# Patient Record
Sex: Male | Born: 1965 | Race: White | Hispanic: No | Marital: Married | Smoking: Former smoker
Health system: Southern US, Community
[De-identification: ages and names within clinical notes are randomized; demographics above are authoritative.]

## PROBLEM LIST (undated history)

## (undated) DIAGNOSIS — M779 Enthesopathy, unspecified: Secondary | ICD-10-CM

## (undated) DIAGNOSIS — E291 Testicular hypofunction: Secondary | ICD-10-CM

## (undated) DIAGNOSIS — R002 Palpitations: Secondary | ICD-10-CM

## (undated) DIAGNOSIS — K760 Fatty (change of) liver, not elsewhere classified: Secondary | ICD-10-CM

## (undated) DIAGNOSIS — R7303 Prediabetes: Secondary | ICD-10-CM

## (undated) DIAGNOSIS — K219 Gastro-esophageal reflux disease without esophagitis: Secondary | ICD-10-CM

## (undated) DIAGNOSIS — R7989 Other specified abnormal findings of blood chemistry: Secondary | ICD-10-CM

## (undated) DIAGNOSIS — N529 Male erectile dysfunction, unspecified: Secondary | ICD-10-CM

## (undated) DIAGNOSIS — C44310 Basal cell carcinoma of skin of unspecified parts of face: Secondary | ICD-10-CM

## (undated) DIAGNOSIS — I10 Essential (primary) hypertension: Secondary | ICD-10-CM

## (undated) DIAGNOSIS — E7800 Pure hypercholesterolemia, unspecified: Secondary | ICD-10-CM

## (undated) DIAGNOSIS — E785 Hyperlipidemia, unspecified: Secondary | ICD-10-CM

## (undated) HISTORY — DX: Prediabetes: R73.03

## (undated) HISTORY — DX: Male erectile dysfunction, unspecified: N52.9

## (undated) HISTORY — DX: Testicular hypofunction: E29.1

## (undated) HISTORY — DX: Fatty (change of) liver, not elsewhere classified: K76.0

## (undated) HISTORY — DX: Basal cell carcinoma of skin of unspecified parts of face: C44.310

## (undated) HISTORY — DX: Hyperlipidemia, unspecified: E78.5

## (undated) HISTORY — DX: Enthesopathy, unspecified: M77.9

## (undated) HISTORY — DX: Other specified abnormal findings of blood chemistry: R79.89

## (undated) HISTORY — DX: Palpitations: R00.2

## (undated) HISTORY — DX: Gastro-esophageal reflux disease without esophagitis: K21.9

---

## 2004-09-24 ENCOUNTER — Ambulatory Visit (HOSPITAL_COMMUNITY): Admission: RE | Admit: 2004-09-24 | Discharge: 2004-09-24 | Payer: Self-pay | Admitting: Orthopedic Surgery

## 2004-09-24 ENCOUNTER — Ambulatory Visit (HOSPITAL_BASED_OUTPATIENT_CLINIC_OR_DEPARTMENT_OTHER): Admission: RE | Admit: 2004-09-24 | Discharge: 2004-09-24 | Payer: Self-pay | Admitting: Orthopedic Surgery

## 2015-09-01 ENCOUNTER — Other Ambulatory Visit: Payer: Self-pay | Admitting: Cardiology

## 2015-09-01 DIAGNOSIS — I10 Essential (primary) hypertension: Secondary | ICD-10-CM

## 2015-09-18 ENCOUNTER — Ambulatory Visit
Admission: RE | Admit: 2015-09-18 | Discharge: 2015-09-18 | Disposition: A | Discharge status: Home / Self Care | Payer: No Typology Code available for payment source | Source: Ambulatory Visit | Attending: Cardiology | Admitting: Cardiology

## 2015-09-18 DIAGNOSIS — I10 Essential (primary) hypertension: Secondary | ICD-10-CM

## 2017-07-28 ENCOUNTER — Other Ambulatory Visit: Payer: Self-pay | Admitting: Family Medicine

## 2017-07-28 ENCOUNTER — Ambulatory Visit
Admission: RE | Admit: 2017-07-28 | Discharge: 2017-07-28 | Disposition: A | Discharge status: Home / Self Care | Payer: No Typology Code available for payment source | Source: Ambulatory Visit | Attending: Family Medicine | Admitting: Family Medicine

## 2017-07-28 DIAGNOSIS — J189 Pneumonia, unspecified organism: Secondary | ICD-10-CM

## 2017-07-28 DIAGNOSIS — J181 Lobar pneumonia, unspecified organism: Principal | ICD-10-CM

## 2019-04-04 DIAGNOSIS — I10 Essential (primary) hypertension: Secondary | ICD-10-CM | POA: Diagnosis not present

## 2019-04-04 DIAGNOSIS — E782 Mixed hyperlipidemia: Secondary | ICD-10-CM | POA: Diagnosis not present

## 2019-04-04 DIAGNOSIS — K219 Gastro-esophageal reflux disease without esophagitis: Secondary | ICD-10-CM | POA: Diagnosis not present

## 2019-04-04 DIAGNOSIS — E291 Testicular hypofunction: Secondary | ICD-10-CM | POA: Diagnosis not present

## 2019-04-04 DIAGNOSIS — Z125 Encounter for screening for malignant neoplasm of prostate: Secondary | ICD-10-CM | POA: Diagnosis not present

## 2019-04-04 DIAGNOSIS — Z0000 Encounter for general adult medical examination without abnormal findings: Secondary | ICD-10-CM | POA: Diagnosis not present

## 2021-05-20 ENCOUNTER — Other Ambulatory Visit: Payer: Self-pay | Admitting: Family Medicine

## 2021-05-20 DIAGNOSIS — E782 Mixed hyperlipidemia: Secondary | ICD-10-CM

## 2021-06-08 ENCOUNTER — Encounter (HOSPITAL_COMMUNITY): Payer: Self-pay | Admitting: Emergency Medicine

## 2021-06-08 ENCOUNTER — Emergency Department (HOSPITAL_COMMUNITY): Payer: No Typology Code available for payment source

## 2021-06-08 ENCOUNTER — Other Ambulatory Visit: Payer: Self-pay

## 2021-06-08 ENCOUNTER — Emergency Department (HOSPITAL_COMMUNITY)
Admission: EM | Admit: 2021-06-08 | Discharge: 2021-06-09 | Disposition: A | Discharge status: Home / Self Care | Payer: No Typology Code available for payment source | Attending: Emergency Medicine | Admitting: Emergency Medicine

## 2021-06-08 DIAGNOSIS — Z20822 Contact with and (suspected) exposure to covid-19: Secondary | ICD-10-CM | POA: Insufficient documentation

## 2021-06-08 DIAGNOSIS — R410 Disorientation, unspecified: Secondary | ICD-10-CM | POA: Insufficient documentation

## 2021-06-08 DIAGNOSIS — R404 Transient alteration of awareness: Secondary | ICD-10-CM | POA: Diagnosis not present

## 2021-06-08 DIAGNOSIS — R451 Restlessness and agitation: Secondary | ICD-10-CM | POA: Insufficient documentation

## 2021-06-08 DIAGNOSIS — R4182 Altered mental status, unspecified: Secondary | ICD-10-CM | POA: Diagnosis present

## 2021-06-08 HISTORY — DX: Pure hypercholesterolemia, unspecified: E78.00

## 2021-06-08 HISTORY — DX: Essential (primary) hypertension: I10

## 2021-06-08 LAB — COMPREHENSIVE METABOLIC PANEL
ALT: 52 U/L — ABNORMAL HIGH (ref 0–44)
AST: 33 U/L (ref 15–41)
Albumin: 4.4 g/dL (ref 3.5–5.0)
Alkaline Phosphatase: 43 U/L (ref 38–126)
Anion gap: 9 (ref 5–15)
BUN: 22 mg/dL — ABNORMAL HIGH (ref 6–20)
CO2: 22 mmol/L (ref 22–32)
Calcium: 9.3 mg/dL (ref 8.9–10.3)
Chloride: 108 mmol/L (ref 98–111)
Creatinine, Ser: 0.99 mg/dL (ref 0.61–1.24)
GFR, Estimated: >60 mL/min (ref >60)
Glucose, Bld: 103 mg/dL — ABNORMAL HIGH (ref 70–99)
Potassium: 3.7 mmol/L (ref 3.5–5.1)
Sodium: 139 mmol/L (ref 135–145)
Total Bilirubin: 0.9 mg/dL (ref 0.3–1.2)
Total Protein: 7 g/dL (ref 6.5–8.1)

## 2021-06-08 LAB — RAPID URINE DRUG SCREEN, HOSP PERFORMED
Amphetamines: NOT DETECTED
Barbiturates: NOT DETECTED
Benzodiazepines: NOT DETECTED
Cocaine: NOT DETECTED
Opiates: NOT DETECTED
Tetrahydrocannabinol: NOT DETECTED

## 2021-06-08 LAB — DIFFERENTIAL
Abs Immature Granulocytes: 0.05 10*3/uL (ref 0.00–0.07)
Basophils Absolute: 0.1 10*3/uL (ref 0.0–0.1)
Basophils Relative: 1 %
Eosinophils Absolute: 0.1 10*3/uL (ref 0.0–0.5)
Eosinophils Relative: 1 %
Immature Granulocytes: 1 %
Lymphocytes Relative: 26 %
Lymphs Abs: 2.7 10*3/uL (ref 0.7–4.0)
Monocytes Absolute: 0.8 10*3/uL (ref 0.1–1.0)
Monocytes Relative: 7 %
Neutro Abs: 6.8 10*3/uL (ref 1.7–7.7)
Neutrophils Relative %: 64 %

## 2021-06-08 LAB — URINALYSIS, ROUTINE W REFLEX MICROSCOPIC
Bilirubin Urine: NEGATIVE
Glucose, UA: NEGATIVE mg/dL
Hgb urine dipstick: NEGATIVE
Ketones, ur: NEGATIVE mg/dL
Leukocytes,Ua: NEGATIVE
Nitrite: NEGATIVE
Protein, ur: NEGATIVE mg/dL
Specific Gravity, Urine: 1.003 — ABNORMAL LOW (ref 1.005–1.030)
pH: 6 (ref 5.0–8.0)

## 2021-06-08 LAB — CBC
HCT: 46.6 % (ref 39.0–52.0)
Hemoglobin: 15.6 g/dL (ref 13.0–17.0)
MCH: 30.1 pg (ref 26.0–34.0)
MCHC: 33.5 g/dL (ref 30.0–36.0)
MCV: 90 fL (ref 80.0–100.0)
Platelets: 232 10*3/uL (ref 150–400)
RBC: 5.18 MIL/uL (ref 4.22–5.81)
RDW: 13.2 % (ref 11.5–15.5)
WBC: 10.5 10*3/uL (ref 4.0–10.5)
nRBC: 0 % (ref 0.0–0.2)

## 2021-06-08 LAB — PROTIME-INR
INR: 1.1 (ref 0.8–1.2)
Prothrombin Time: 13.6 seconds (ref 11.4–15.2)

## 2021-06-08 LAB — ETHANOL: Alcohol, Ethyl (B): <10 mg/dL (ref <10)

## 2021-06-08 LAB — I-STAT BETA HCG BLOOD, ED (MC, WL, AP ONLY): I-stat hCG, quantitative: <5 m[IU]/mL (ref <5)

## 2021-06-08 LAB — RESP PANEL BY RT-PCR (FLU A&B, COVID) ARPGX2
Influenza A by PCR: NEGATIVE
Influenza B by PCR: NEGATIVE
SARS Coronavirus 2 by RT PCR: NEGATIVE

## 2021-06-08 LAB — APTT: aPTT: 26 seconds (ref 24–36)

## 2021-06-08 NOTE — ED Triage Notes (Signed)
Pt reports going out to mow this evening and had episodes of confusion since 1830 ? ?

## 2021-06-08 NOTE — ED Provider Triage Note (Signed)
Emergency Medicine Provider Triage Evaluation Note ? ?Steven Le , a 56 y.o. male  was evaluated in triage.  Patient presents with his wife for evaluation of memory problem that began shortly PTA. LKN 18:30 tonight @ dinner. Went to mow the grass after dinner and had a "dream like" state, returned to the house and could not remember what occurred outside, he was confused/disoriented per his wife with repetitive questioning. This continued and he repeatedly went outside to check to see if he truly did mow the grass. He is feeling better currently, his wife reports he still seems a bit off/repetitive and he still does not remember mowing or his repetitively checking to see if he mowed the lawn. He has had some increased stressors at home recently, otherwise was a normal day. His wife states she asked him about other sxs during his repetitive questioning (numbness, weakness, dizziness, pain) and he denied them throughout the night.  ? ?Review of Systems  ?Positive: Memory problem, repetitive speech ?Negative: Slurred speech, numbness, weakness, headache, change in vision, chest pain ? ?Physical Exam  ?BP 135/82 (BP Location: Right Arm)   Pulse 81   Temp 98.4 ?F (36.9 ?C) (Oral)   Resp 16   SpO2 96%  ?Gen:   Awake, no distress   ?Resp:  Normal effort  ?MSK:   Moves extremities without difficulty  ?Other:  Alert.  Clear speech.  CN III through XII grossly intact.  Sensation grossly tact bilateral upper and lower extremities.  5 out of 5 symmetric grip strength and strength with plantar dorsiflexion bilaterally.  No drift.  Ambulatory with steady gait.  Oriented x3. ? ?Medical Decision Making  ?Medically screening exam initiated at 10:21 PM.  Appropriate orders placed.  Steven Le was informed that the remainder of the evaluation will be completed by another provider, this initial triage assessment does not replace that evaluation, and the importance of remaining in the ED until their evaluation is  complete. ? ?Patient presenting with altered mental status described as difficulty with memory and repetitive questioning, improved on arrival but still does not recall earlier events and seems to be a little repetitive still her his wife. He has no focal neurologic deficits on exam and is speaking fluently with steady gait.  Last known normal 1830. ? ?I discussed case with neurologist Dr. Rory Percy, no need to activate code stroke, recommends stroke work up with CTA head/neck and subsequent MRI brain wo contrast.  ?  ?Amaryllis Dyke, PA-C ?06/08/21 2231 ? ?

## 2021-06-09 ENCOUNTER — Emergency Department (HOSPITAL_COMMUNITY): Payer: No Typology Code available for payment source

## 2021-06-09 MED ORDER — IOHEXOL 350 MG/ML SOLN
100.0000 mL | Freq: Once | INTRAVENOUS | Status: AC | PRN
  Administered 2021-06-09: 75 mL via INTRAVENOUS

## 2021-06-09 NOTE — ED Notes (Signed)
Patient transported to MRI 

## 2021-06-09 NOTE — ED Provider Notes (Signed)
?[facility] ?Provider Note ? ? ?CSN: [id number] ?Arrival date & time: 06/08/21  2125 ? ?  ? ?History ? ?Chief Complaint  ?Patient presents with  ? Altered Mental Status  ? ? ?Steven Le is a 56 y.o. male. ? ?Patient presents to the emergency department for evaluation of altered mental status.  Patient was in his normal state of good health earlier today.  He came home from work and did some yard work.  After he finished mowing and trimming, he came into the house.  His wife noticed that he was slightly agitated and confused when he came in.  He did not seem to remember what he had been doing and was asking the same questions over and over.  She did not notice any facial asymmetry, slurred speech, unilateral weakness.  Patient denies any associated headache.  He denies any possibility of a fall or head injury. ? ? ?  ? ?Home Medications ?Prior to Admission medications   ?Medication Sig Start Date End Date Taking? Authorizing Provider  ?losartan (COZAAR) 50 MG tablet Take 50 mg by mouth daily. 05/04/21  Yes [provider]  ?Multiple Vitamin (MULTIVITAMIN) tablet Take 1 tablet by mouth daily.   Yes [provider]  ?   ? ?Allergies    ?Cough & chest congestion dm [dextromethorphan-guaifenesin]   ? ?Review of Systems   ?Review of Systems ? ?Physical Exam ?Updated Vital Signs ?BP 119/73   Pulse 63   Temp 98.4 ?F (36.9 ?C) (Oral)   Resp 17   Ht '6\' 1"'$  (1.854 m)   Wt 89.8 kg   SpO2 96%   BMI 26.12 kg/m?  ?Physical Exam ?Vitals and nursing note reviewed.  ?Constitutional:   ?   General: He is not in acute distress. ?   Appearance: He is well-developed.  ?HENT:  ?   Head: Normocephalic and atraumatic.  ?   Mouth/Throat:  ?   Mouth: Mucous membranes are moist.  ?Eyes:  ?   General: Vision grossly intact. Gaze aligned appropriately.  ?   Extraocular Movements: Extraocular movements intact.  ?   Conjunctiva/sclera: Conjunctivae normal.  ?Cardiovascular:  ?    Rate and Rhythm: Normal rate and regular rhythm.  ?   Pulses: Normal pulses.  ?   Heart sounds: Normal heart sounds, S1 normal and S2 normal. No murmur heard. ?  No friction rub. No gallop.  ?Pulmonary:  ?   Effort: Pulmonary effort is normal. No respiratory distress.  ?   Breath sounds: Normal breath sounds.  ?Abdominal:  ?   Palpations: Abdomen is soft.  ?   Tenderness: There is no abdominal tenderness. There is no guarding or rebound.  ?   Hernia: No hernia is present.  ?Musculoskeletal:     ?   General: No swelling.  ?   Cervical back: Full passive range of motion without pain, normal range of motion and neck supple. No pain with movement, spinous process tenderness or muscular tenderness. Normal range of motion.  ?   Right lower leg: No edema.  ?   Left lower leg: No edema.  ?Skin: ?   General: Skin is warm and dry.  ?   Capillary Refill: Capillary refill takes less than 2 seconds.  ?   Findings: No ecchymosis, erythema, lesion or wound.  ?Neurological:  ?   Mental Status: He is alert and oriented to person, place, and time.  ?   GCS: GCS eye subscore is 4.  GCS verbal subscore is 5. GCS motor subscore is 6.  ?   Cranial Nerves: Cranial nerves 2-12 are intact.  ?   Sensory: Sensation is intact.  ?   Motor: Motor function is intact. No weakness or abnormal muscle tone.  ?   Coordination: Coordination is intact.  ?Psychiatric:     ?   Mood and Affect: Mood normal.     ?   Speech: Speech normal.     ?   Behavior: Behavior normal.  ? ? ?ED Results / Procedures / Treatments   ?Labs ?(all labs ordered are listed, but only abnormal results are displayed) ?Labs Reviewed  ?COMPREHENSIVE METABOLIC PANEL - Abnormal; Notable for the following components:  ?    Result Value  ? Glucose, Bld 103 (*)   ? BUN 22 (*)   ? ALT 52 (*)   ? All other components within normal limits  ?URINALYSIS, ROUTINE W REFLEX MICROSCOPIC - Abnormal; Notable for the following components:  ? Color, Urine COLORLESS (*)   ? Specific Gravity, Urine  1.003 (*)   ? All other components within normal limits  ?RESP PANEL BY RT-PCR (FLU A&B, COVID) ARPGX2  ?ETHANOL  ?PROTIME-INR  ?APTT  ?CBC  ?DIFFERENTIAL  ?RAPID URINE DRUG SCREEN, HOSP PERFORMED  ?I-STAT CHEM 8, ED  ?I-STAT BETA HCG BLOOD, ED ([facility], [facility], [facility] ONLY)  ? ? ?EKG ?EKG Interpretation ? ?Date/Time:  Tuesday Jun 08 2021 22:29:46 EDT ?Ventricular Rate:  80 ?PR Interval:  148 ?QRS Duration: 92 ?QT Interval:  388 ?QTC Calculation: 447 ?R Axis:   85 ?Text Interpretation: Normal sinus rhythm Normal ECG Confirmed by Orpah Greek [phone]) on 06/08/2021 11:33:25 PM ? ?Radiology ?CT ANGIO HEAD NECK W WO CM ? ?Result Date: 06/09/2021 ?CLINICAL DATA:  Memory loss EXAM: CT ANGIOGRAPHY HEAD AND NECK TECHNIQUE: Multidetector CT imaging of the head and neck was performed using the standard protocol during bolus administration of intravenous contrast. Multiplanar CT image reconstructions and MIPs were obtained to evaluate the vascular anatomy. Carotid stenosis measurements (when applicable) are obtained utilizing NASCET criteria, using the distal internal carotid diameter as the denominator. RADIATION DOSE REDUCTION: This exam was performed according to the departmental dose-optimization program which includes automated exposure control, adjustment of the mA and/or kV according to patient size and/or use of iterative reconstruction technique. CONTRAST:  8m OMNIPAQUE IOHEXOL 350 MG/ML SOLN COMPARISON:  None Available. FINDINGS: CT HEAD FINDINGS Brain: There is no mass, hemorrhage or extra-axial collection. The size and configuration of the ventricles and extra-axial CSF spaces are normal. There is no acute or chronic infarction. The brain parenchyma is normal. Skull: The visualized skull base, calvarium and extracranial soft tissues are normal. Sinuses/Orbits: No fluid levels or advanced mucosal thickening of the visualized paranasal sinuses. No mastoid or middle ear effusion. The orbits are normal. CTA NECK FINDINGS  SKELETON: There is no bony spinal canal stenosis. No lytic or blastic lesion. OTHER NECK: Normal pharynx, larynx and major salivary glands. No cervical lymphadenopathy. Unremarkable thyroid gland. UPPER CHEST: No pneumothorax or pleural effusion. No nodules or masses. AORTIC ARCH: There is no calcific atherosclerosis of the aortic arch. There is no aneurysm, dissection or hemodynamically significant stenosis of the visualized portion of the aorta. Conventional 3 vessel aortic branching pattern. The visualized proximal subclavian arteries are widely patent. RIGHT CAROTID SYSTEM: Normal without aneurysm, dissection or stenosis. LEFT CAROTID SYSTEM: Normal without aneurysm, dissection or stenosis. VERTEBRAL ARTERIES: Left dominant configuration. Both origins are clearly patent. There is no  dissection, occlusion or flow-limiting stenosis to the skull base (V1-V3 segments). CTA HEAD FINDINGS POSTERIOR CIRCULATION: --Vertebral arteries: Normal V4 segments. --Inferior cerebellar arteries: Normal. --Basilar artery: Normal. --Superior cerebellar arteries: Normal. --Posterior cerebral arteries (PCA): Normal. ANTERIOR CIRCULATION: --Intracranial internal carotid arteries: Normal. --Anterior cerebral arteries (ACA): Normal. Both A1 segments are present. Patent anterior communicating artery (a-comm). --Middle cerebral arteries (MCA): Normal. VENOUS SINUSES: As permitted by contrast timing, patent. ANATOMIC VARIANTS: None Review of the MIP images confirms the above findings. IMPRESSION: Normal CTA of the head and neck. Electronically Signed   By: Ulyses Jarred M.D.   On: 06/09/2021 00:44  ? ?MR BRAIN WO CONTRAST ? ?Result Date: 06/09/2021 ?CLINICAL DATA:  Mental status change EXAM: MRI HEAD WITHOUT CONTRAST TECHNIQUE: Multiplanar, multiecho pulse sequences of the brain and surrounding structures were obtained without intravenous contrast. COMPARISON:  None Available. FINDINGS: Brain: No acute infarct, mass effect or extra-axial  collection. No acute or chronic hemorrhage. Normal white matter signal, parenchymal volume and CSF spaces. The midline structures are normal. Vascular: Major flow voids are preserved. Skull and upper cervical spine:

## 2021-06-23 ENCOUNTER — Ambulatory Visit
Admission: RE | Admit: 2021-06-23 | Discharge: 2021-06-23 | Disposition: A | Discharge status: Home / Self Care | Payer: No Typology Code available for payment source | Source: Ambulatory Visit | Attending: Family Medicine | Admitting: Family Medicine

## 2021-06-23 DIAGNOSIS — E782 Mixed hyperlipidemia: Secondary | ICD-10-CM

## 2021-07-22 ENCOUNTER — Encounter: Payer: Self-pay | Admitting: Neurology

## 2021-07-22 ENCOUNTER — Ambulatory Visit: Payer: No Typology Code available for payment source | Admitting: Neurology

## 2021-07-22 VITALS — BP 102/64 | HR 78 | Ht 73.0 in | Wt 198.0 lb

## 2021-07-22 DIAGNOSIS — G454 Transient global amnesia: Secondary | ICD-10-CM

## 2021-07-22 DIAGNOSIS — E291 Testicular hypofunction: Secondary | ICD-10-CM | POA: Insufficient documentation

## 2021-07-22 DIAGNOSIS — R404 Transient alteration of awareness: Secondary | ICD-10-CM

## 2021-07-22 DIAGNOSIS — R7303 Prediabetes: Secondary | ICD-10-CM | POA: Insufficient documentation

## 2021-07-22 DIAGNOSIS — I10 Essential (primary) hypertension: Secondary | ICD-10-CM | POA: Insufficient documentation

## 2021-07-22 DIAGNOSIS — K219 Gastro-esophageal reflux disease without esophagitis: Secondary | ICD-10-CM | POA: Insufficient documentation

## 2021-07-22 NOTE — Patient Instructions (Signed)
Routine EEG, I will contact you to go over the results  Follow up with PCP Return as needed

## 2021-07-22 NOTE — Progress Notes (Signed)
[facility]  PATIENT: Steven Le DOB: Dec 31, 1965  REQUESTING CLINICIAN: Orpah Greek,* HISTORY FROM: Patient  REASON FOR VISIT: Transient alteration of awareness    HISTORICAL  CHIEF COMPLAINT:  Chief Complaint  Patient presents with   New Patient (Initial Visit)    NP internal referral for Transient alteration of awareness Pt was outside doing yard work, States episode lasted 2-3 hrs, reports no new sx today     HISTORY OF PRESENT ILLNESS:  This is a 56 year old gentleman past medical history of hypertension and hyperlipidemia who is presenting after one episode of transient alteration of awareness on May 17.  Patient reports in the evening around 6 PM he went outside to [facility] the lawn then during that time he noted that he did not feel right, was a little lightheaded and came into the house to talk to his wife.  Per chart review, wife described patient as being agitated, going in and out of the house to verify if he actually finished mowing the lawn, wife was on the phone with her nurse friend who directed her to go to the nearest ED.   By the time patient reached the ED he was back to his normal self, he did have MRI brain which was negative for any acute abnormality and he was discharged home.  He denies any previous history of the same and since then states that he has not had any additional events.  He reported that it was in the evening.  it was not too hot when he was mowing the lawn, and he was back to his normal self without any hydration.  OTHER MEDICAL CONDITIONS: Hypertension, Hyperlipidemia    REVIEW OF SYSTEMS: Full 14 system review of systems performed and negative with exception of: as noted in the HPI   ALLERGIES: Allergies  Allergen Reactions   Cough & Chest Congestion Dm [Dextromethorphan-Guaifenesin]     HOME MEDICATIONS: Outpatient Medications Prior to Visit  Medication Sig Dispense Refill   furosemide (LASIX) 20 MG  tablet Take 20 mg by mouth.     losartan (COZAAR) 50 MG tablet Take 50 mg by mouth daily.     Multiple Vitamin (MULTIVITAMIN) tablet Take 1 tablet by mouth daily.     No facility-administered medications prior to visit.    PAST MEDICAL HISTORY: Past Medical History:  Diagnosis Date   Hypercholesteremia    Hypertension     PAST SURGICAL HISTORY: History reviewed. No pertinent surgical history.  FAMILY HISTORY: History reviewed. No pertinent family history.  SOCIAL HISTORY: Social History   Socioeconomic History   Marital status: Married    Spouse name: Not on file   Number of children: Not on file   Years of education: Not on file   Highest education level: Not on file  Occupational History   Not on file  Tobacco Use   Smoking status: Never   Smokeless tobacco: Never  Substance and Sexual Activity   Alcohol use: Not Currently   Drug use: Never   Sexual activity: Yes  Other Topics Concern   Not on file  Social History Narrative   Not on file   Social Determinants of Health   Financial Resource Strain: Not on file  Food Insecurity: Not on file  Transportation Needs: Not on file  Physical Activity: Not on file  Stress: Not on file  Social Connections: Not on file  Intimate Partner Violence: Not on file     PHYSICAL EXAM  GENERAL EXAM/CONSTITUTIONAL: Vitals:  Vitals:   07/22/21 0811  BP: 102/64  Pulse: 78  Weight: 198 lb (89.8 kg)  Height: '6\' 1"'$  (1.854 m)   Body mass index is 26.12 kg/m. Wt Readings from Last 3 Encounters:  07/22/21 198 lb (89.8 kg)  06/08/21 198 lb (89.8 kg)   Patient is in no distress; well developed, nourished and groomed; neck is supple  EYES: Pupils round and reactive to light, Visual fields full to confrontation, Extraocular movements intacts,   MUSCULOSKELETAL: Gait, strength, tone, movements noted in Neurologic exam below  NEUROLOGIC: MENTAL STATUS:      No data to display         awake, alert, oriented to  person, place and time recent and remote memory intact normal attention and concentration language fluent, comprehension intact, naming intact fund of knowledge appropriate  CRANIAL NERVE:  2nd, 3rd, 4th, 6th - pupils equal and reactive to light, visual fields full to confrontation, extraocular muscles intact, no nystagmus 5th - facial sensation symmetric 7th - facial strength symmetric 8th - hearing intact 9th - palate elevates symmetrically, uvula midline 11th - shoulder shrug symmetric 12th - tongue protrusion midline  MOTOR:  normal bulk and tone, full strength in the BUE, BLE  SENSORY:  normal and symmetric to light touch, pinprick, temperature, vibration  COORDINATION:  finger-nose-finger, fine finger movements normal  REFLEXES:  deep tendon reflexes present and symmetric  GAIT/STATION:  normal   DIAGNOSTIC DATA (LABS, IMAGING, TESTING) - I reviewed patient records, labs, notes, testing and imaging myself where available.  Lab Results  Component Value Date   WBC 10.5 06/08/2021   HGB 15.6 06/08/2021   HCT 46.6 06/08/2021   MCV 90.0 06/08/2021   PLT 232 06/08/2021      Component Value Date/Time   NA 139 06/08/2021 2229   K 3.7 06/08/2021 2229   CL 108 06/08/2021 2229   CO2 22 06/08/2021 2229   GLUCOSE 103 (H) 06/08/2021 2229   BUN 22 (H) 06/08/2021 2229   CREATININE 0.99 06/08/2021 2229   CALCIUM 9.3 06/08/2021 2229   PROT 7.0 06/08/2021 2229   ALBUMIN 4.4 06/08/2021 2229   AST 33 06/08/2021 2229   ALT 52 (H) 06/08/2021 2229   ALKPHOS 43 06/08/2021 2229   BILITOT 0.9 06/08/2021 2229   GFRNONAA >60 06/08/2021 2229   No results found for: "CHOL", "HDL", "LDLCALC", "LDLDIRECT", "TRIG", "CHOLHDL" No results found for: "HGBA1C" No results found for: "VITAMINB12" No results found for: "TSH"  MRI Brain without contrast 06/09/21 Normal brain MRI    ASSESSMENT AND PLAN  56 y.o. year old male with history of hypertension and hyperlipidemia who is  presenting after one episode of alteration of awareness lasting up to 3 hours.  During this time patient was asking the same questions and doing the same repetitive motion of checking to make sure that he finished mowing the lawn.  He did have a MRI brain which was negative for any acute abnormality.  Patient episode likely is a transient global amnesia but cannot rule out seizures.  I will obtain a routine EEG for background calcification.  He did not have a stroke.  I will contact the patient to go over the result and if normal he can continue to follow with primary care and return as needed.   1. TGA (transient global amnesia)   2. Transient alteration of awareness      Patient Instructions  Routine EEG, I will contact you to go over the results  Follow up  with PCP Return as needed   Orders Placed This Encounter  Procedures   EEG adult    No orders of the defined types were placed in this encounter.   Return if symptoms worsen or fail to improve.  I have spent a total of 45 minutes dedicated to this patient today, preparing to see patient, performing a medically appropriate examination and evaluation, ordering tests and/or medications and procedures, and counseling and educating the patient/family/caregiver; independently interpreting result and communicating results to the family/patient/caregiver; and documenting clinical information in the electronic medical record.   Alric Ran, MD 07/22/2021, 8:46 AM  [facility] [address] [place] [employer], [place] [address] [phone]

## 2021-08-11 ENCOUNTER — Other Ambulatory Visit: Payer: Self-pay | Admitting: *Deleted

## 2021-08-11 ENCOUNTER — Encounter: Payer: Self-pay | Admitting: Neurology

## 2021-10-06 ENCOUNTER — Encounter: Payer: Self-pay | Admitting: Internal Medicine

## 2021-10-06 ENCOUNTER — Other Ambulatory Visit: Payer: Self-pay | Admitting: Internal Medicine

## 2021-10-06 ENCOUNTER — Ambulatory Visit: Payer: 59 | Attending: Internal Medicine | Admitting: Internal Medicine

## 2021-10-06 VITALS — BP 100/70 | HR 74 | Ht 73.0 in | Wt 202.0 lb

## 2021-10-06 DIAGNOSIS — I10 Essential (primary) hypertension: Secondary | ICD-10-CM | POA: Diagnosis not present

## 2021-10-06 DIAGNOSIS — E782 Mixed hyperlipidemia: Secondary | ICD-10-CM

## 2021-10-06 DIAGNOSIS — Z8673 Personal history of transient ischemic attack (TIA), and cerebral infarction without residual deficits: Secondary | ICD-10-CM | POA: Diagnosis not present

## 2021-10-06 DIAGNOSIS — I2584 Coronary atherosclerosis due to calcified coronary lesion: Secondary | ICD-10-CM

## 2021-10-06 DIAGNOSIS — R002 Palpitations: Secondary | ICD-10-CM | POA: Diagnosis not present

## 2021-10-06 DIAGNOSIS — I2510 Atherosclerotic heart disease of native coronary artery without angina pectoris: Secondary | ICD-10-CM | POA: Insufficient documentation

## 2021-10-06 MED ORDER — DILTIAZEM HCL 30 MG PO TABS: 30.0000 mg | ORAL_TABLET | Freq: Four times a day (QID) | ORAL | 3 refills | Status: AC | PRN

## 2021-10-06 NOTE — Patient Instructions (Signed)
Medication Instructions:  Your physician has recommended you make the following change in your medication:   START: diltiazem (Cardizem) 30 mg by mouth Every 6 hours as needed for heart palpitations  *If you need a refill on your cardiac medications before your next appointment, please call your pharmacy*   Lab Work: NONE If you have labs (blood work) drawn today and your tests are completely normal, you will receive your results only by: [place] (if you have MyChart) OR A paper copy in the mail If you have any lab test that is abnormal or we need to change your treatment, we will call you to review the results.   Testing/Procedures: Your physician has requested that you wear a 30 day heart monitor.     Follow-Up: At [facility], you and your health needs are our priority.  As part of our continuing mission to provide you with exceptional heart care, we have created designated Provider Care Teams.  These Care Teams include your primary Cardiologist (physician) and Advanced Practice Providers (APPs -  Physician Assistants and Nurse Practitioners) who all work together to provide you with the care you need, when you need it.  We recommend signing up for the patient portal called "MyChart".  Sign up information is provided on this After Visit Summary.  MyChart is used to connect with patients for Virtual Visits (Telemedicine).  Patients are able to view lab/test results, encounter notes, upcoming appointments, etc.  Non-urgent messages can be sent to your provider as well.   To learn more about what you can do with MyChart, go to [email or web address].    Your next appointment:   6 month(s)  The format for your next appointment:   In Person  Provider:   Werner Lean, MD     Other Instructions [employer] Cardiac Event Monitor Instructions Your physician has requested you wear your cardiac event monitor for ___30__ days, (1-30). [employer] may call or  text to confirm a shipping address. The monitor will be sent to a land address via UPS. [employer] will not ship a monitor to a PO BOX. It typically takes 3-5 days to receive your monitor after it has been enrolled. [employer] will assist with USPS tracking if your package is delayed. The telephone number for [employer] is [phone]. Once you have received your monitor, please review the enclosed instructions. Instruction tutorials can also be viewed under help and settings on the enclosed cell phone. Your monitor has already been registered assigning a specific monitor serial # to you.  Applying the monitor Remove cell phone from case and turn it on. The cell phone works as Dealer and needs to be within [employer] of you at all times. The cell phone will need to be charged on a daily basis. We recommend you plug the cell phone into the enclosed charger at your bedside table every night.  Monitor batteries: You will receive two monitor batteries labelled #1 and #2. These are your recorders. Plug battery #2 onto the second connection on the enclosed charger. Keep one battery on the charger at all times. This will keep the monitor battery deactivated. It will also keep it fully charged for when you need to switch your monitor batteries. A small light will be blinking on the battery emblem when it is charging. The light on the battery emblem will remain on when the battery is fully charged.  Open package of a Monitor strip. Insert battery #1 into black hood on strip and  gently squeeze monitor battery onto connection as indicated in instruction booklet. Set aside while preparing skin.  Choose location for your strip, vertical or horizontal, as indicated in the instruction booklet. Shave to remove all hair from location. There cannot be any lotions, oils, powders, or colognes on skin where monitor is to be applied. Wipe skin clean with enclosed Saline wipe. Dry skin completely.  Peel  paper labeled #1 off the back of the Monitor strip exposing the adhesive. Place the monitor on the chest in the vertical or horizontal position shown in the instruction booklet. One arrow on the monitor strip must be pointing upward. Carefully remove paper labeled #2, attaching remainder of strip to your skin. Try not to create any folds or wrinkles in the strip as you apply it.  Firmly press and release the circle in the center of the monitor battery. You will hear a small beep. This is turning the monitor battery on. The heart emblem on the monitor battery will light up every 5 seconds if the monitor battery in turned on and connected to the patient securely. Do not push and hold the circle down as this turns the monitor battery off. The cell phone will locate the monitor battery. A screen will appear on the cell phone checking the connection of your monitor strip. This may read poor connection initially but change to good connection within the next minute. Once your monitor accepts the connection you will hear a series of 3 beeps followed by a climbing crescendo of beeps. A screen will appear on the cell phone showing the two monitor strip placement options. Touch the picture that demonstrates where you applied the monitor strip.  Your monitor strip and battery are waterproof. You are able to shower, bathe, or swim with the monitor on. They just ask you do not submerge deeper than 3 feet underwater. We recommend removing the monitor if you are swimming in a lake, river, or ocean.  Your monitor battery will need to be switched to a fully charged monitor battery approximately once a week. The cell phone will alert you of an action which needs to be made.  On the cell phone, tap for details to reveal connection status, monitor battery status, and cell phone battery status. The green dots indicates your monitor is in good status. A red dot indicates there is something that needs your  attention.  To record a symptom, click the circle on the monitor battery. In 30-60 seconds a list of symptoms will appear on the cell phone. Select your symptom and tap save. Your monitor will record a sustained or significant arrhythmia regardless of you clicking the button. Some patients do not feel the heart rhythm irregularities. [employer] will notify us of any serious or critical events.  Refer to instruction booklet for instructions on switching batteries, changing strips, the Do not disturb or Pause features, or any additional questions.  Call [employer] at [phone], to confirm your monitor is transmitting and record your baseline. They will answer any questions you may have regarding the monitor instructions at that time.  Returning the monitor to [place] all equipment back into blue box. Peel off strip of paper to expose adhesive and close box securely. There is a prepaid UPS shipping label on this box. Drop in a UPS drop box, or at a UPS facility like [employer]. You may also contact [employer] to arrange UPS to pick up monitor package at your home.   Important Information About Sugar

## 2021-10-06 NOTE — Progress Notes (Signed)
Cardiology Office Note:    Date:  10/06/2021   ID:  Steven Le, DOB 09-09-1965, MRN [id number]  PCP:  Antony Contras, MD   [facility] Cardiologist:  Werner Lean, MD     Referring MD: Antony Contras, MD   CC: New palpitations Consulted for the evaluation of PAF at the behest of Dr. Moreen Fowler.  History of Present Illness:    Steven Le is a 56 y.o. male with a hx of HTN, HLD, TIA 05/2021, elevated CAC who presents with new PAF.  Patient notes that he is feeling having sudden onset heart racing.  On his smart watch his phone thinks he is in atrial fibrillation.   Feeling well. 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    Has two episodes of "A fib on his phone,"  One occurred in September that occurred for almost two hours.  Felt chest pain during that time.  Had another episode during lunchtime and after exercising.  Able to golf and do lawn work without symptoms.  No other chest pain, chest pressure, chest tightness, chest stinging.  No shortness of breath, DOE .  No PND or orthopnea.  No weight gain, leg swelling , or abdominal swelling.  No syncope or near syncope   Patient reports prior cardiac testing including CAC.  Ten years ago had a stress test that was negative.     Past Medical History:  Diagnosis Date   ED (erectile dysfunction)    Facial basal cell cancer    Fatty infiltration of liver    GERD (gastroesophageal reflux disease)    Hypercholesteremia    Hyperlipidemia    Hypertension    Hypogonadism in male    Low testosterone    Palpitations    Pre-diabetes    Tendonitis     No past surgical history on file.  Current Medications: Current Meds  Medication Sig   aspirin EC 81 MG tablet Take 81 mg by mouth daily. Swallow whole.   diltiazem (CARDIZEM) 30 MG tablet Take 1 tablet (30 mg total) by mouth every 6 (six) hours as needed (palpitations).   furosemide (LASIX) 20 MG tablet Take 20 mg by  mouth.   losartan (COZAAR) 50 MG tablet Take 50 mg by mouth daily.   Multiple Vitamin (MULTIVITAMIN) tablet Take 1 tablet by mouth daily.   rosuvastatin (CRESTOR) 5 MG tablet Take 5 mg by mouth daily.     Allergies:   Cough & chest congestion dm [dextromethorphan-guaifenesin]   Social History   Socioeconomic History   Marital status: Married    Spouse name: Not on file   Number of children: Not on file   Years of education: Not on file   Highest education level: Not on file  Occupational History   Not on file  Tobacco Use   Smoking status: Former    Types: Cigarettes   Smokeless tobacco: Never  Substance and Sexual Activity   Alcohol use: Not Currently   Drug use: Never   Sexual activity: Yes  Other Topics Concern   Not on file  Social History Narrative   Not on file   Social Determinants of Health   Financial Resource Strain: Not on file  Food Insecurity: Not on file  Transportation Needs: Not on file  Physical Activity: Not on file  Stress: Not on file  Social Connections: Not on file     Family History: The patient's family history includes Cancer - Colon (age  of onset: 48) in his father; Cancer - Prostate (age of onset: 68) in his father; Colon polyps in his brother; Diabetes in his father and mother; Healthy in his brother, daughter, sister, and son; Hypertension in his mother; Skin cancer in his mother. Father had a prior stroke (still living) Mother had potentially CAD (she took nitroglycerin).  ROS:   Please see the history of present illness.     All other systems reviewed and are negative.  EKGs/Labs/Other Studies Reviewed:    The following studies were reviewed today:  EKG:  EKG is  ordered today.  The ekg ordered today demonstrates  10/06/21: SR   CT CARDIAC SCORING 09/18/2015  Narrative CLINICAL DATA:  Essential hypertension. Family history of heart disease. Nonsmoker.  EXAM: CT HEART FOR CALCIUM SCORING  TECHNIQUE: CT heart was performed  on a 64 channel system using prospective ECG gating.  A non-contrast exam for calcium scoring was performed.  Note that this exam targets the heart and the chest was not imaged in its entirety.  COMPARISON:  None.  FINDINGS: Technical quality: Good.  CORONARY CALCIUM  Total Agatston Score: 0  OTHER  FINDINGS:  Cardiovascular: Normal heart size.  Normal aortic caliber.  Mediastinum/Nodes: No imaged thoracic adenopathy, given limitations of noncontrast technique.  Lungs/Pleura: No pleural fluid.  Clear lungs.  Upper Abdomen: Moderate hepatic steatosis. Normal imaged portions of the spleen, stomach.  Musculoskeletal: No acute osseous abnormality.  IMPRESSION: 1. No coronary calcium. 2. Moderate hepatic steatosis. 3.  No acute process in the chest.   Electronically Signed By: Abigail Miyamoto M.D. On: 09/18/2015 13:55     Recent Labs: 06/08/2021: ALT 52; BUN 22; Creatinine, Ser 0.99; Hemoglobin 15.6; Platelets 232; Potassium 3.7; Sodium 139  Recent Lipid Panel No results found for: "CHOL", "TRIG", "HDL", "CHOLHDL", "VLDL", "LDLCALC", "LDLDIRECT"       Physical Exam:    VS:  BP 100/70   Pulse 74   Ht '6\' 1"'$  (1.854 m)   Wt 202 lb (91.6 kg)   SpO2 95%   BMI 26.65 kg/m     Wt Readings from Last 3 Encounters:  10/06/21 202 lb (91.6 kg)  07/22/21 198 lb (89.8 kg)  06/08/21 198 lb (89.8 kg)    Gen: No distress   Neck: No JVD Cardiac: No Rubs or Gallops, No murmur, RRR +2 radial pulses Respiratory: Clear to auscultation bilaterally, normal effort, normal  respiratory rate GI: Soft, nontender, non-distended  MS: No  edema;  moves all extremities Integument: Skin feels warm Neuro:  At time of evaluation, alert and oriented to person/place/time/situation  Psych: Normal affect, patient feels well   ASSESSMENT:    1. Palpitations   2. History of TIA (transient ischemic attack)   3. Mixed hyperlipidemia   4. Essential hypertension   5. Coronary artery  calcification    PLAN:    New Palpitations With history of HTN, HLD, and TIA - has had three episodes in the past month; given his new TID will get 30 Day [employer] - will start diltiazem 30 mg PO q6hr PRN Palpitations - if needs standing therapy will need to drop his losartan to at least 25 mg PO daily  Elevated CAC - will get follow up Lipids and ALT with PCP on Monday - continue ASA for now  Six months unless new AF; me or APP        Medication Adjustments/Labs and Tests Ordered: Current medicines are reviewed at length with the patient today.  Concerns  regarding medicines are outlined above.  Orders Placed This Encounter  Procedures   CARDIAC EVENT MONITOR   EKG 12-Lead   Meds ordered this encounter  Medications   diltiazem (CARDIZEM) 30 MG tablet    Sig: Take 1 tablet (30 mg total) by mouth every 6 (six) hours as needed (palpitations).    Dispense:  90 tablet    Refill:  3    Patient Instructions  Medication Instructions:  Your physician has recommended you make the following change in your medication:   START: diltiazem (Cardizem) 30 mg by mouth Every 6 hours as needed for heart palpitations  *If you need a refill on your cardiac medications before your next appointment, please call your pharmacy*   Lab Work: NONE If you have labs (blood work) drawn today and your tests are completely normal, you will receive your results only by: [place] (if you have MyChart) OR A paper copy in the mail If you have any lab test that is abnormal or we need to change your treatment, we will call you to review the results.   Testing/Procedures: Your physician has requested that you wear a 30 day heart monitor.     Follow-Up: At [facility], you and your health needs are our priority.  As part of our continuing mission to provide you with exceptional heart care, we have created designated Provider Care Teams.  These Care Teams include your primary  Cardiologist (physician) and Advanced Practice Providers (APPs -  Physician Assistants and Nurse Practitioners) who all work together to provide you with the care you need, when you need it.  We recommend signing up for the patient portal called "MyChart".  Sign up information is provided on this After Visit Summary.  MyChart is used to connect with patients for Virtual Visits (Telemedicine).  Patients are able to view lab/test results, encounter notes, upcoming appointments, etc.  Non-urgent messages can be sent to your provider as well.   To learn more about what you can do with MyChart, go to [email or web address].    Your next appointment:   6 month(s)  The format for your next appointment:   In Person  Provider:   Werner Lean, MD     Other Instructions [employer] Cardiac Event Monitor Instructions Your physician has requested you wear your cardiac event monitor for ___30__ days, (1-30). [employer] may call or text to confirm a shipping address. The monitor will be sent to a land address via UPS. [employer] will not ship a monitor to a PO BOX. It typically takes 3-5 days to receive your monitor after it has been enrolled. [employer] will assist with USPS tracking if your package is delayed. The telephone number for [employer] is [phone]. Once you have received your monitor, please review the enclosed instructions. Instruction tutorials can also be viewed under help and settings on the enclosed cell phone. Your monitor has already been registered assigning a specific monitor serial # to you.  Applying the monitor Remove cell phone from case and turn it on. The cell phone works as Dealer and needs to be within [employer] of you at all times. The cell phone will need to be charged on a daily basis. We recommend you plug the cell phone into the enclosed charger at your bedside table every night.  Monitor batteries: You will receive two monitor batteries  labelled #1 and #2. These are your recorders. Plug battery #2 onto the second connection on the enclosed charger. Keep one  battery on the charger at all times. This will keep the monitor battery deactivated. It will also keep it fully charged for when you need to switch your monitor batteries. A small light will be blinking on the battery emblem when it is charging. The light on the battery emblem will remain on when the battery is fully charged.  Open package of a Monitor strip. Insert battery #1 into black hood on strip and gently squeeze monitor battery onto connection as indicated in instruction booklet. Set aside while preparing skin.  Choose location for your strip, vertical or horizontal, as indicated in the instruction booklet. Shave to remove all hair from location. There cannot be any lotions, oils, powders, or colognes on skin where monitor is to be applied. Wipe skin clean with enclosed Saline wipe. Dry skin completely.  Peel paper labeled #1 off the back of the Monitor strip exposing the adhesive. Place the monitor on the chest in the vertical or horizontal position shown in the instruction booklet. One arrow on the monitor strip must be pointing upward. Carefully remove paper labeled #2, attaching remainder of strip to your skin. Try not to create any folds or wrinkles in the strip as you apply it.  Firmly press and release the circle in the center of the monitor battery. You will hear a small beep. This is turning the monitor battery on. The heart emblem on the monitor battery will light up every 5 seconds if the monitor battery in turned on and connected to the patient securely. Do not push and hold the circle down as this turns the monitor battery off. The cell phone will locate the monitor battery. A screen will appear on the cell phone checking the connection of your monitor strip. This may read poor connection initially but change to good connection within the next minute.  Once your monitor accepts the connection you will hear a series of 3 beeps followed by a climbing crescendo of beeps. A screen will appear on the cell phone showing the two monitor strip placement options. Touch the picture that demonstrates where you applied the monitor strip.  Your monitor strip and battery are waterproof. You are able to shower, bathe, or swim with the monitor on. They just ask you do not submerge deeper than 3 feet underwater. We recommend removing the monitor if you are swimming in a lake, river, or ocean.  Your monitor battery will need to be switched to a fully charged monitor battery approximately once a week. The cell phone will alert you of an action which needs to be made.  On the cell phone, tap for details to reveal connection status, monitor battery status, and cell phone battery status. The green dots indicates your monitor is in good status. A red dot indicates there is something that needs your attention.  To record a symptom, click the circle on the monitor battery. In 30-60 seconds a list of symptoms will appear on the cell phone. Select your symptom and tap save. Your monitor will record a sustained or significant arrhythmia regardless of you clicking the button. Some patients do not feel the heart rhythm irregularities. [employer] will notify us of any serious or critical events.  Refer to instruction booklet for instructions on switching batteries, changing strips, the Do not disturb or Pause features, or any additional questions.  Call [employer] at [phone], to confirm your monitor is transmitting and record your baseline. They will answer any questions you may have regarding the monitor instructions at  that time.  Returning the monitor to [place] all equipment back into blue box. Peel off strip of paper to expose adhesive and close box securely. There is a prepaid UPS shipping label on this box. Drop in a UPS drop box, or at a UPS  facility like [employer]. You may also contact [employer] to arrange UPS to pick up monitor package at your home.   Important Information About Sugar         Signed, Werner Lean, MD  10/06/2021 9:49 AM    [place]

## 2021-10-11 ENCOUNTER — Ambulatory Visit: Payer: 59 | Attending: Internal Medicine

## 2021-10-11 DIAGNOSIS — R002 Palpitations: Secondary | ICD-10-CM | POA: Diagnosis not present

## 2021-10-11 DIAGNOSIS — Z8673 Personal history of transient ischemic attack (TIA), and cerebral infarction without residual deficits: Secondary | ICD-10-CM | POA: Diagnosis not present

## 2021-10-18 ENCOUNTER — Telehealth: Payer: Self-pay | Admitting: Student

## 2021-10-18 DIAGNOSIS — I480 Paroxysmal atrial fibrillation: Secondary | ICD-10-CM

## 2021-10-18 MED ORDER — APIXABAN 5 MG PO TABS: 5.0000 mg | ORAL_TABLET | Freq: Two times a day (BID) | ORAL | 11 refills | Status: DC

## 2021-10-18 NOTE — Addendum Note (Signed)
Addended by: Sande Rives on: 10/18/2021 08:45 AM   Modules accepted: Orders

## 2021-10-18 NOTE — Telephone Encounter (Addendum)
   Received page from Answering Service about concerns for critical EKG.  Called and spoke with [employer] staff who reported patient had a 44-second episode of atrial fibrillation with rates in the 150s earlier this morning around 6:04 AM central time.  Underlying rhythm was normal sinus rhythm in the 90s.  Reviewed patient's charts.  He was recently seen by Dr. Gasper Sells for further evaluation of palpitations and reported his smart watch had indicated that he was in atrial fibrillation twice.  One episode reportedly lasted for almost 2 hours.  He was started on short acting Cardizem  as needed for palpitations and 30 day monitor was ordered.  He does have a history of TIA.  CHA2DS2-VASc score = 3 given history of TIA and hypertension.  Therefore, I would recommend starting Eliquis 5 mg twice daily.  I tried call patient with the above recommendations but was unable to reach him.  Left a detailed message on his answering machine asking him to call back and that he is okay per DPR).  I will route this message to Dr. Gasper Sells so that he is aware.  I will also route this message to the triage pool so that they can try to call him again later today with above recommendations.  Darreld Mclean, PA-C 10/18/2021 8:23 AM  Patient called back and I was able to speak with him. He does report intermittent shortness of breath and chest tightness when he is in atrial fibrillation as well as some heart racing. Discussed when to take his PRN Diltiazem. Discussed recommendation for Eliquis and he is in agreement with this. He has no history of GI bleed, brain bleed, or internal bleeding. Recommended stopping Aspirin while on Eliquis. Will send in Eliquis to requested pharmacy.  Also arranged follow-up with Dr. Gasper Sells on 11/03/2021 to review full monitor results and see if he needs to be transitioned to long acting Diltiazem. Did not do this today because BP is soft at times. Systolic BP was 426 at  recent office visit.  Darreld Mclean, PA-C 10/18/2021 8:44 AM

## 2021-10-18 NOTE — Telephone Encounter (Signed)
error 

## 2021-10-18 NOTE — Telephone Encounter (Addendum)
   Cardiac Monitor Alert  Date of alert:  10/18/2021   Patient Name: Steven Le  DOB: Nov 21, 1965  MRN: [id number]   [place] Cardiologist: Werner Lean, MD  [facility] EP:  None    Monitor Information: Cardiac Event Monitor [[employer]]  Reason:  Palpitations and TIA Ordering provider:  Dr. Gasper Sells   Alert Atrial Fibrillation/Flutter This is the 1st alert for this rhythm.  The patient has no hx of Atrial Fibrillation/Flutter.     Next Cardiology Appointment   Date:  11/04/23  Provider:  Dr. Gasper Sells.   The patient was contacted today.  He is symptomatic.  He reports the following symptoms:  He does report intermittent shortness of breath and chest tightness when he is in atrial fibrillation as well as some heart racing. Arrhythmia, symptoms and history reviewed with [name] and Dr. Gasper Sells.  Plan:  Start Eliquis 5 mg tablets BID and   [name], [name], MD          10/18/21 10:25 AM If he has another spell we will drop his losartan to 25 mg PO daily and start diltiazem 120 mg XL.  Other:  Dr. Gasper Sells reviewed and signed monitor report.    [name], RN  10/18/2021 11:10 AM

## 2021-10-18 NOTE — Telephone Encounter (Signed)
Called pt advised of 30 days worth of samples, 30 day free trial card and $10 co pay card.  Pt will come by and pick up today on lunch breaks.  Pt had no questions or concerns.

## 2021-11-03 ENCOUNTER — Ambulatory Visit: Payer: 59 | Attending: Internal Medicine | Admitting: Internal Medicine

## 2021-11-03 ENCOUNTER — Encounter: Payer: Self-pay | Admitting: Internal Medicine

## 2021-11-03 VITALS — BP 100/60 | HR 70 | Ht 73.0 in | Wt 207.0 lb

## 2021-11-03 DIAGNOSIS — E782 Mixed hyperlipidemia: Secondary | ICD-10-CM | POA: Diagnosis not present

## 2021-11-03 DIAGNOSIS — Z8673 Personal history of transient ischemic attack (TIA), and cerebral infarction without residual deficits: Secondary | ICD-10-CM

## 2021-11-03 DIAGNOSIS — I10 Essential (primary) hypertension: Secondary | ICD-10-CM

## 2021-11-03 DIAGNOSIS — I480 Paroxysmal atrial fibrillation: Secondary | ICD-10-CM

## 2021-11-03 NOTE — Patient Instructions (Signed)
Medication Instructions:  Your physician recommends that you continue on your current medications as directed. Please refer to the Current Medication list given to you today.  *If you need a refill on your cardiac medications before your next appointment, please call your pharmacy*   Lab Work: ON DAY OF ECHO: CBC If you have labs (blood work) drawn today and your tests are completely normal, you will receive your results only by: [place] (if you have MyChart) OR A paper copy in the mail If you have any lab test that is abnormal or we need to change your treatment, we will call you to review the results.   Testing/Procedures: Your physician has requested that you have an echocardiogram. Echocardiography is a painless test that uses sound waves to create images of your heart. It provides your doctor with information about the size and shape of your heart and how well your heart's chambers and valves are working. This procedure takes approximately one hour. There are no restrictions for this procedure.    Follow-Up: At [facility], you and your health needs are our priority.  As part of our continuing mission to provide you with exceptional heart care, we have created designated Provider Care Teams.  These Care Teams include your primary Cardiologist (physician) and Advanced Practice Providers (APPs -  Physician Assistants and Nurse Practitioners) who all work together to provide you with the care you need, when you need it.  We recommend signing up for the patient portal called "MyChart".  Sign up information is provided on this After Visit Summary.  MyChart is used to connect with patients for Virtual Visits (Telemedicine).  Patients are able to view lab/test results, encounter notes, upcoming appointments, etc.  Non-urgent messages can be sent to your provider as well.   To learn more about what you can do with MyChart, go to [email or web address].    Your next  appointment:   12 month(s)  The format for your next appointment:   In Person  Provider:   Werner Lean, MD      Important Information About Sugar

## 2021-11-03 NOTE — Progress Notes (Signed)
Cardiology Office Note:    Date:  11/03/2021   ID:  [name], DOB 1965-04-10, MRN [id number]  PCP:  Antony Contras, MD   [place] [facility] Cardiologist:  Werner Lean, MD     Referring MD: Antony Contras, MD   CC: Follow up AF  History of Present Illness:    Steven Le is a 56 y.o. male with a hx of HTN, HLD, TIA 05/2021, elevated CAC who presents with new PAF. 2023: Found AF on heart monitor.  Started CCB.  Have no received full monitor.  Patient notes that he is doing fine.   Rare scratches lead to notable bleeding. There are no interval hospital/ED visit.    No chest pain or pressure .  No SOB/DOE and no PND/Orthopnea.  No weight gain or leg swelling.  Rare palpitations.  Has not needed diltiazem.   AMB BP 120/80s but low Bps in our office.  Takes Sildenafil as a PRN.    Past Medical History:  Diagnosis Date   ED (erectile dysfunction)    Facial basal cell cancer    Fatty infiltration of liver    GERD (gastroesophageal reflux disease)    Hypercholesteremia    Hyperlipidemia    Hypertension    Hypogonadism in male    Low testosterone    Palpitations    Pre-diabetes    Tendonitis     No past surgical history on file.  Current Medications: Current Meds  Medication Sig   apixaban (ELIQUIS) 5 MG TABS tablet Take 1 tablet (5 mg total) by mouth 2 (two) times daily.   diltiazem (CARDIZEM) 30 MG tablet Take 1 tablet (30 mg total) by mouth every 6 (six) hours as needed (palpitations).   furosemide (LASIX) 20 MG tablet Take 20 mg by mouth.   losartan (COZAAR) 50 MG tablet Take 50 mg by mouth daily.   Multiple Vitamin (MULTIVITAMIN) tablet Take 1 tablet by mouth daily.   rosuvastatin (CRESTOR) 5 MG tablet Take 5 mg by mouth daily.   sildenafil (VIAGRA) 25 MG tablet Take 25 mg by mouth as needed for erectile dysfunction.     Allergies:   Cough & chest congestion dm [dextromethorphan-guaifenesin]   Social History   Socioeconomic  History   Marital status: Married    Spouse name: Not on file   Number of children: Not on file   Years of education: Not on file   Highest education level: Not on file  Occupational History   Not on file  Tobacco Use   Smoking status: Former    Types: Cigarettes   Smokeless tobacco: Never  Substance and Sexual Activity   Alcohol use: Not Currently   Drug use: Never   Sexual activity: Yes  Other Topics Concern   Not on file  Social History Narrative   Not on file   Social Determinants of Health   Financial Resource Strain: Not on file  Food Insecurity: Not on file  Transportation Needs: Not on file  Physical Activity: Not on file  Stress: Not on file  Social Connections: Not on file    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    Family History: The patient's family history includes Cancer - Colon (age of onset: 25) in his father; Cancer - Prostate (age of onset: 39) in his father; Colon polyps in his brother; Diabetes in his father and mother; Healthy in his brother, daughter, sister, and son; Hypertension in his mother; Skin  cancer in his mother. Father had a prior stroke (still living) Mother had potentially CAD (she took nitroglycerin).  ROS:   Please see the history of present illness.     All other systems reviewed and are negative.  EKGs/Labs/Other Studies Reviewed:    The following studies were reviewed today:  EKG:  EKG is  ordered today.  The ekg ordered today demonstrates  10/06/21: SR   CT CARDIAC SCORING 09/18/2015  Narrative CLINICAL DATA:  Essential hypertension. Family history of heart disease. Nonsmoker.  EXAM: CT HEART FOR CALCIUM SCORING  TECHNIQUE: CT heart was performed on a 64 channel system using prospective ECG gating.  A non-contrast exam for calcium scoring was performed.  Note that this exam targets the heart and the chest was not imaged in its entirety.  COMPARISON:   None.  FINDINGS: Technical quality: Good.  CORONARY CALCIUM  Total Agatston Score: 0  OTHER  FINDINGS:  Cardiovascular: Normal heart size.  Normal aortic caliber.  Mediastinum/Nodes: No imaged thoracic adenopathy, given limitations of noncontrast technique.  Lungs/Pleura: No pleural fluid.  Clear lungs.  Upper Abdomen: Moderate hepatic steatosis. Normal imaged portions of the spleen, stomach.  Musculoskeletal: No acute osseous abnormality.  IMPRESSION: 1. No coronary calcium. 2. Moderate hepatic steatosis. 3.  No acute process in the chest.   Electronically Signed By: Abigail Miyamoto M.D. On: 09/18/2015 13:55     Recent Labs: 06/08/2021: ALT 52; BUN 22; Creatinine, Ser 0.99; Hemoglobin 15.6; Platelets 232; Potassium 3.7; Sodium 139  Recent Lipid Panel No results found for: "CHOL", "TRIG", "HDL", "CHOLHDL", "VLDL", "LDLCALC", "LDLDIRECT"       Physical Exam:    VS:  BP 100/60   Pulse 70   Ht '6\' 1"'$  (1.854 m)   Wt 207 lb (93.9 kg)   SpO2 95%   BMI 27.31 kg/m     Wt Readings from Last 3 Encounters:  11/03/21 207 lb (93.9 kg)  10/06/21 202 lb (91.6 kg)  07/22/21 198 lb (89.8 kg)    Gen: No distress   Neck: No JVD Cardiac: No Rubs or Gallops, No murmur, RRR +2 radial pulses Respiratory: Clear to auscultation bilaterally, normal effort, normal  respiratory rate GI: Soft, nontender, non-distended  MS: No  edema;  moves all extremities Integument: Skin feels warm Neuro:  At time of evaluation, alert and oriented to person/place/time/situation  Psych: Normal affect, patient feels well   ASSESSMENT:    1. Paroxysmal atrial fibrillation (HCC)   2. History of TIA (transient ischemic attack)   3. Mixed hyperlipidemia   4. Essential hypertension    PLAN:    PAF With history of HTN, HLD, and TIA - CHADSVASC 3 - diltiazem as a PRM - on DOAC, CBC and echo - restart ambulatory BP monitoring; at echo results will check in about BP, if low no sildenafil while  low and cut cozaar to 25 mg - DOAC no ASA  HLD hx of TIA - LDL is near 70 goal on current statin no change for now  One year with me         Medication Adjustments/Labs and Tests Ordered: Current medicines are reviewed at length with the patient today.  Concerns regarding medicines are outlined above.  Orders Placed This Encounter  Procedures   CBC   ECHOCARDIOGRAM COMPLETE   No orders of the defined types were placed in this encounter.   Patient Instructions  Medication Instructions:  Your physician recommends that you continue on your current medications as directed.  Please refer to the Current Medication list given to you today.  *If you need a refill on your cardiac medications before your next appointment, please call your pharmacy*   Lab Work: ON DAY OF ECHO: CBC If you have labs (blood work) drawn today and your tests are completely normal, you will receive your results only by: [place] (if you have MyChart) OR A paper copy in the mail If you have any lab test that is abnormal or we need to change your treatment, we will call you to review the results.   Testing/Procedures: Your physician has requested that you have an echocardiogram. Echocardiography is a painless test that uses sound waves to create images of your heart. It provides your doctor with information about the size and shape of your heart and how well your heart's chambers and valves are working. This procedure takes approximately one hour. There are no restrictions for this procedure.    Follow-Up: At [facility], you and your health needs are our priority.  As part of our continuing mission to provide you with exceptional heart care, we have created designated Provider Care Teams.  These Care Teams include your primary Cardiologist (physician) and Advanced Practice Providers (APPs -  Physician Assistants and Nurse Practitioners) who all work together to provide you with the care you  need, when you need it.  We recommend signing up for the patient portal called "MyChart".  Sign up information is provided on this After Visit Summary.  MyChart is used to connect with patients for Virtual Visits (Telemedicine).  Patients are able to view lab/test results, encounter notes, upcoming appointments, etc.  Non-urgent messages can be sent to your provider as well.   To learn more about what you can do with MyChart, go to [email or web address].    Your next appointment:   12 month(s)  The format for your next appointment:   In Person  Provider:   Werner Lean, MD      Important Information About Sugar         Signed, Werner Lean, MD  11/03/2021 5:00 PM    [place]

## 2021-11-19 ENCOUNTER — Other Ambulatory Visit (HOSPITAL_COMMUNITY): Payer: 59

## 2021-11-19 ENCOUNTER — Other Ambulatory Visit: Payer: 59

## 2021-11-29 ENCOUNTER — Ambulatory Visit: Payer: 59

## 2021-11-29 ENCOUNTER — Ambulatory Visit: Payer: 59 | Attending: Internal Medicine

## 2021-11-29 DIAGNOSIS — I480 Paroxysmal atrial fibrillation: Secondary | ICD-10-CM

## 2021-11-29 LAB — ECHOCARDIOGRAM COMPLETE
AR max vel: 1.9 cm2
AV Area VTI: 1.85 cm2
AV Area mean vel: 1.84 cm2
AV Mean grad: 7 mmHg
AV Peak grad: 13.2 mmHg
Ao pk vel: 1.82 m/s
Area-P 1/2: 3.86 cm2
S' Lateral: 2.6 cm

## 2021-11-29 LAB — CBC
Hematocrit: 43.8 % (ref 37.5–51.0)
Hemoglobin: 14.7 g/dL (ref 13.0–17.7)
MCH: 30.1 pg (ref 26.6–33.0)
MCHC: 33.6 g/dL (ref 31.5–35.7)
MCV: 90 fL (ref 79–97)
Platelets: 237 10*3/uL (ref 150–450)
RBC: 4.88 x10E6/uL (ref 4.14–5.80)
RDW: 14.3 % (ref 11.6–15.4)
WBC: 7.3 10*3/uL (ref 3.4–10.8)

## 2021-12-01 ENCOUNTER — Telehealth: Payer: Self-pay | Admitting: Internal Medicine

## 2021-12-01 NOTE — Telephone Encounter (Signed)
° °  Pt is returning call to get echo result °

## 2021-12-01 NOTE — Telephone Encounter (Signed)
Called pt reviewed results please see result notes.

## 2022-04-14 NOTE — Progress Notes (Deleted)
Cardiology Office Note:    Date:  04/14/2022   ID:  Steven Le, DOB 08/30/1965, MRN [id number]  PCP:  Antony Contras, MD   [place] Prov[facility] Cardiologist:  Werner Lean, MD     Referring MD: Antony Contras, MD   CC: Follow up AF  History of Present Illness:    Steven Le is a 56 y.o. male with a hx of HTN, HLD, TIA 05/2021, elevated CAC who presents with new PAF. 2023: Found AF on heart monitor.  Started CCB.  Have no received full monitor.  Patient notes that he is doing ***.   Since last visit notes *** . There are no*** interval hospital/ED visit.    No chest pain or pressure ***.  No SOB/DOE*** and no PND/Orthopnea***.  No weight gain or leg swelling***.  No palpitations or syncope ***.  Ambulatory blood pressure ***.    Past Medical History:  Diagnosis Date   ED (erectile dysfunction)    Facial basal cell cancer    Fatty infiltration of liver    GERD (gastroesophageal reflux disease)    Hypercholesteremia    Hyperlipidemia    Hypertension    Hypogonadism in male    Low testosterone    Palpitations    Pre-diabetes    Tendonitis     No past surgical history on file.  Current Medications: No outpatient medications have been marked as taking for the 04/18/22 encounter (Appointment) with Werner Lean, MD.     Allergies:   Cough & chest congestion dm [dextromethorphan-guaifenesin]   Social History   Socioeconomic History   Marital status: Married    Spouse name: Not on file   Number of children: Not on file   Years of education: Not on file   Highest education level: Not on file  Occupational History   Not on file  Tobacco Use   Smoking status: Former    Types: Cigarettes   Smokeless tobacco: Never  Substance and Sexual Activity   Alcohol use: Not Currently   Drug use: Never   Sexual activity: Yes  Other Topics Concern   Not on file  Social History Narrative   Not on file   Social Determinants of  Health   Financial Resource Strain: Not on file  Food Insecurity: Not on file  Transportation Needs: Not on file  Physical Activity: Not on file  Stress: Not on file  Social Connections: Not on file    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    Family History: The patient's family history includes Cancer - Colon (age of onset: 19) in his father; Cancer - Prostate (age of onset: 41) in his father; Colon polyps in his brother; Diabetes in his father and mother; Healthy in his brother, daughter, sister, and son; Hypertension in his mother; Skin cancer in his mother. Father had a prior stroke (still living) Mother had potentially CAD (she took nitroglycerin).  ROS:   Please see the history of present illness.     All other systems reviewed and are negative.  EKGs/Labs/Other Studies Reviewed:    The following studies were reviewed today:  EKG:  EKG is  ordered today.  The ekg ordered today demonstrates  10/06/21: SR   Cardiac Studies & Procedures       ECHOCARDIOGRAM  ECHOCARDIOGRAM COMPLETE 11/29/2021  Narrative ECHOCARDIOGRAM REPORT    Patient Name:   Steven Le Date of Exam: 11/29/2021 Medical Rec #:  [id number]  Height:       73.0 in Accession #:    [id number]      Weight:       207.0 lb Date of Birth:  November 19, 1965       BSA:          2.183 m Patient Age:    88 years        BP:           100/60 mmHg Patient Gender: M               HR:           77 bpm. Exam Location:  [place]  Procedure: 2D Echo, Cardiac Doppler and Color Doppler  Indications:    I48.0 Atrial Fibrillation  History:        Patient has no prior history of Echocardiogram examinations. TIA, Arrythmias:Atrial Fibrillation; Risk Factors:Hypertension, Dyslipidemia, Family History of Coronary Artery Disease and Former Smoker. Palpitations.  Sonographer:    Deliah Boston RDCS Referring Phys: [facility] [name]  IMPRESSIONS   1. Left ventricular  ejection fraction, by estimation, is >75%. The left ventricle has hyperdynamic function. The left ventricle has no regional wall motion abnormalities. Left ventricular diastolic parameters were normal. 2. Right ventricular systolic function is normal. The right ventricular size is normal. There is normal pulmonary artery systolic pressure. The estimated right ventricular systolic pressure is 123456 mmHg. 3. The mitral valve is grossly normal. Trivial mitral valve regurgitation. 4. The aortic valve is tricuspid. Aortic valve regurgitation is not visualized. 5. The inferior vena cava is normal in size with greater than 50% respiratory variability, suggesting right atrial pressure of 3 mmHg.  Comparison(s): No prior Echocardiogram.  FINDINGS Left Ventricle: Left ventricular ejection fraction, by estimation, is >75%. The left ventricle has hyperdynamic function. The left ventricle has no regional wall motion abnormalities. The left ventricular internal cavity size was normal in size. There is no left ventricular hypertrophy. Left ventricular diastolic parameters were normal.  Right Ventricle: The right ventricular size is normal. No increase in right ventricular wall thickness. Right ventricular systolic function is normal. There is normal pulmonary artery systolic pressure. The tricuspid regurgitant velocity is 2.66 m/s, and with an assumed right atrial pressure of 3 mmHg, the estimated right ventricular systolic pressure is 123456 mmHg.  Left Atrium: Left atrial size was normal in size.  Right Atrium: Right atrial size was normal in size.  Pericardium: There is no evidence of pericardial effusion.  Mitral Valve: The mitral valve is grossly normal. Trivial mitral valve regurgitation.  Tricuspid Valve: The tricuspid valve is grossly normal. Tricuspid valve regurgitation is trivial.  Aortic Valve: The aortic valve is tricuspid. Aortic valve regurgitation is not visualized. Aortic valve mean gradient  measures 7.0 mmHg. Aortic valve peak gradient measures 13.2 mmHg. Aortic valve area, by VTI measures 1.85 cm.  Pulmonic Valve: The pulmonic valve was normal in structure. Pulmonic valve regurgitation is not visualized.  Aorta: The aortic root and ascending aorta are structurally normal, with no evidence of dilitation.  Venous: The inferior vena cava is normal in size with greater than 50% respiratory variability, suggesting right atrial pressure of 3 mmHg.  IAS/Shunts: No atrial level shunt detected by color flow Doppler.   LEFT VENTRICLE PLAX 2D LVIDd:         4.90 cm   Diastology LVIDs:         2.60 cm   LV e' medial:    12.60 cm/s LV PW:  0.90 cm   LV E/e' medial:  7.1 LV IVS:        0.80 cm   LV e' lateral:   18.80 cm/s LVOT diam:     2.00 cm   LV E/e' lateral: 4.8 LV SV:         61 LV SV Index:   28 LVOT Area:     3.14 cm   RIGHT VENTRICLE RV Basal diam:  3.20 cm RV S prime:     15.00 cm/s TAPSE (M-mode): 3.3 cm  LEFT ATRIUM             Index        RIGHT ATRIUM           Index LA diam:        4.10 cm 1.88 cm/m   RA Area:     14.50 cm LA Vol (A2C):   48.0 ml 21.98 ml/m  RA Volume:   32.20 ml  14.75 ml/m LA Vol (A4C):   31.3 ml 14.34 ml/m LA Biplane Vol: 39.5 ml 18.09 ml/m AORTIC VALVE AV Area (Vmax):    1.90 cm AV Area (Vmean):   1.84 cm AV Area (VTI):     1.85 cm AV Vmax:           182.00 cm/s AV Vmean:          121.000 cm/s AV VTI:            0.330 m AV Peak Grad:      13.2 mmHg AV Mean Grad:      7.0 mmHg LVOT Vmax:         110.00 cm/s LVOT Vmean:        70.950 cm/s LVOT VTI:          0.194 m LVOT/AV VTI ratio: 0.59  AORTA Ao Root diam: 3.10 cm Ao Asc diam:  2.90 cm  MITRAL VALVE               TRICUSPID VALVE MV Area (PHT): cm         TR Peak grad:   28.3 mmHg MV Decel Time: 197 msec    TR Vmax:        266.00 cm/s MV E velocity: 90.05 cm/s MV A velocity: 73.90 cm/s  SHUNTS MV E/A ratio:  1.22        Systemic VTI:  0.19 m Systemic  Diam: 2.00 cm  Lyman Bishop MD Electronically signed by Lyman Bishop MD Signature Date/Time: 11/29/2021/8:39:27 PM    Final    MONITORS  CARDIAC EVENT MONITOR 11/21/2021  Narrative   Patient had a minimum heart rate of 54 bpm, maximum heart rate of 160 bpm, and average heart rate of 79 bpm.   Predominant underlying rhythm was sinus rhythm.   Rare atrial fibrillation RVR (7 minutes).   Isolated PACs were rare (<1.0%).   Isolated PVCs were rare (<1.0%).   Triggered and diary events associated with A fib and SR.  Rare atrial fibrillation.   CT SCANS  CT CARDIAC SCORING (SELF PAY ONLY) 09/18/2015  Narrative CLINICAL DATA:  Essential hypertension. Family history of heart disease. Nonsmoker.  EXAM: CT HEART FOR CALCIUM SCORING  TECHNIQUE: CT heart was performed on a 64 channel system using prospective ECG gating.  A non-contrast exam for calcium scoring was performed.  Note that this exam targets the heart and the chest was not imaged in its entirety.  COMPARISON:  None.  FINDINGS: Technical quality: Good.  CORONARY  CALCIUM  Total Agatston Score: 0  OTHER  FINDINGS:  Cardiovascular: Normal heart size.  Normal aortic caliber.  Mediastinum/Nodes: No imaged thoracic adenopathy, given limitations of noncontrast technique.  Lungs/Pleura: No pleural fluid.  Clear lungs.  Upper Abdomen: Moderate hepatic steatosis. Normal imaged portions of the spleen, stomach.  Musculoskeletal: No acute osseous abnormality.  IMPRESSION: 1. No coronary calcium. 2. Moderate hepatic steatosis. 3.  No acute process in the chest.   Electronically Signed By: Abigail Miyamoto M.D. On: 09/18/2015 13:55            Recent Labs: 06/08/2021: ALT 52; BUN 22; Creatinine, Ser 0.99; Potassium 3.7; Sodium 139 11/29/2021: Hemoglobin 14.7; Platelets 237  Recent Lipid Panel No results found for: "CHOL", "TRIG", "HDL", "CHOLHDL", "VLDL", "LDLCALC", "LDLDIRECT"       Physical Exam:     VS:  There were no vitals taken for this visit.    Wt Readings from Last 3 Encounters:  11/03/21 207 lb (93.9 kg)  10/06/21 202 lb (91.6 kg)  07/22/21 198 lb (89.8 kg)    Gen: No distress   Neck: No JVD Cardiac: No Rubs or Gallops, No murmur, RRR +2 radial pulses Respiratory: Clear to auscultation bilaterally, normal effort, normal  respiratory rate GI: Soft, nontender, non-distended  MS: No  edema;  moves all extremities Integument: Skin feels warm Neuro:  At time of evaluation, alert and oriented to person/place/time/situation  Psych: Normal affect, patient feels well   ASSESSMENT:    No diagnosis found.  PLAN:    PAF With history of HTN, HLD, and TIA - CHADSVASC 3 - diltiazem as a PRM - on DOAC, CBC and echo - restart ambulatory BP monitoring; at echo results will check in about BP, if low no sildenafil while low and cut cozaar to 25 mg - DOAC no ASA  HLD hx of TIA - LDL is near 70 goal on current statin no change for now  One year with me         Medication Adjustments/Labs and Tests Ordered: Current medicines are reviewed at length with the patient today.  Concerns regarding medicines are outlined above.  No orders of the defined types were placed in this encounter.  No orders of the defined types were placed in this encounter.   There are no Patient Instructions on file for this visit.   Signed, Werner Lean, MD  04/14/2022 10:15 AM    [place]

## 2022-04-18 ENCOUNTER — Ambulatory Visit: Payer: 59 | Admitting: Internal Medicine

## 2022-04-25 ENCOUNTER — Telehealth: Payer: Self-pay

## 2022-04-25 ENCOUNTER — Ambulatory Visit: Payer: 59 | Attending: Internal Medicine | Admitting: Internal Medicine

## 2022-04-25 ENCOUNTER — Encounter: Payer: Self-pay | Admitting: Internal Medicine

## 2022-04-25 VITALS — BP 108/68 | HR 75 | Ht 73.0 in | Wt 198.6 lb

## 2022-04-25 DIAGNOSIS — I10 Essential (primary) hypertension: Secondary | ICD-10-CM

## 2022-04-25 DIAGNOSIS — Z8673 Personal history of transient ischemic attack (TIA), and cerebral infarction without residual deficits: Secondary | ICD-10-CM | POA: Diagnosis not present

## 2022-04-25 DIAGNOSIS — R400 Somnolence: Secondary | ICD-10-CM | POA: Diagnosis not present

## 2022-04-25 DIAGNOSIS — I480 Paroxysmal atrial fibrillation: Secondary | ICD-10-CM | POA: Diagnosis not present

## 2022-04-25 DIAGNOSIS — E782 Mixed hyperlipidemia: Secondary | ICD-10-CM

## 2022-04-25 MED ORDER — DILTIAZEM HCL ER COATED BEADS 120 MG PO CP24
120.0000 mg | ORAL_CAPSULE | Freq: Every day | ORAL | 3 refills | Status: DC
Start: 2022-04-25 — End: 2023-02-14

## 2022-04-25 MED ORDER — LOSARTAN POTASSIUM 25 MG PO TABS: 25.0000 mg | ORAL_TABLET | Freq: Every day | ORAL | 3 refills | Status: AC

## 2022-04-25 NOTE — Progress Notes (Signed)
Cardiology Office Note:    Date:  04/25/2022   ID:  Steven Le, DOB 1965-10-02, MRN [id number]  PCP:  Antony Contras, MD   [facility] Prov[facility] Cardiologist:  Werner Lean, MD     Referring MD: Antony Contras, MD   CC: Follow up AF  History of Present Illness:    Steven Le is a 57 y.o. male with a hx of HTN, HLD, TIA 05/2021, elevated CAC who presents with new PAF. 2023: Found AF on heart monitor.  Started CCB.  Have no received full monitor.  Patient notes that he has has some episodes of atrial fibrillation .   Since last visit notes that he has had a few episodes of atrial fibrillation. There are no interval hospital/ED visit.    Rare palpitations and heart rates are up and feels poorly.  Feel anxious. No chest pain or pressure .  No SOB/DOE and no PND/Orthopnea.  No weight gain or leg swelling.   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  AMB BP: 108/70 when asymptomatic; reviewed log on App  Past Medical History:  Diagnosis Date   ED (erectile dysfunction)    Facial basal cell cancer    Fatty infiltration of liver    GERD (gastroesophageal reflux disease)    Hypercholesteremia    Hyperlipidemia    Hypertension    Hypogonadism in male    Low testosterone    Palpitations    Pre-diabetes    Tendonitis     No past surgical history on file.  Current Medications: Current Meds  Medication Sig   apixaban (ELIQUIS) 5 MG TABS tablet Take 1 tablet (5 mg total) by mouth 2 (two) times daily.   diltiazem (CARDIZEM CD) 120 MG 24 hr capsule Take 1 capsule (120 mg total) by mouth daily.   diltiazem (CARDIZEM) 30 MG tablet Take 1 tablet (30 mg total) by mouth every 6 (six) hours as needed (palpitations).   losartan (COZAAR) 25 MG tablet Take 1 tablet (25 mg total) by mouth daily.   Multiple Vitamin (MULTIVITAMIN) tablet Take 1 tablet by mouth daily.   rosuvastatin (CRESTOR) 5 MG tablet Take 5 mg by mouth daily.   sildenafil (VIAGRA) 25  MG tablet Take 25 mg by mouth as needed for erectile dysfunction.   [DISCONTINUED] furosemide (LASIX) 20 MG tablet Take 20 mg by mouth.   [DISCONTINUED] losartan (COZAAR) 50 MG tablet Take 50 mg by mouth daily.     Allergies:   Cough & chest congestion dm [dextromethorphan-guaifenesin], Dextromethorphan hbr, and Niacin   Social History   Socioeconomic History   Marital status: Married    Spouse name: Not on file   Number of children: Not on file   Years of education: Not on file   Highest education level: Not on file  Occupational History   Not on file  Tobacco Use   Smoking status: Former    Types: Cigarettes   Smokeless tobacco: Never  Substance and Sexual Activity   Alcohol use: Not Currently   Drug use: Never   Sexual activity: Yes  Other Topics Concern   Not on file  Social History Narrative   Not on file   Social Determinants of Health   Financial Resource Strain: Not on file  Food Insecurity: Not on file  Transportation Needs: Not on file  Physical Activity: Not on file  Stress: Not on file  Social Connections: Not on file    Social: He is the [employer]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  No alcohol.   Family History: The patient's family history includes Cancer - Colon (age of onset: 76) in his father; Cancer - Prostate (age of onset: 51) in his father; Colon polyps in his brother; Diabetes in his father and mother; Healthy in his brother, daughter, sister, and son; Hypertension in his mother; Skin cancer in his mother. Father had a prior stroke (still living) Mother had potentially CAD (she took nitroglycerin).  ROS:   Please see the history of present illness.     All other systems reviewed and are negative.  EKGs/Labs/Other Studies Reviewed:    The following studies were reviewed today:  EKG:   10/06/21: SR   Cardiac Studies & Procedures       ECHOCARDIOGRAM  ECHOCARDIOGRAM COMPLETE 11/29/2021  Narrative ECHOCARDIOGRAM  REPORT    Patient Name:   Steven Le Date of Exam: 11/29/2021 Medical Rec #:  [id number]       Height:       73.0 in Accession #:    [id number]      Weight:       207.0 lb Date of Birth:  Mar 01, 1965       BSA:          2.183 m Patient Age:    25 years        BP:           100/60 mmHg Patient Gender: M               HR:           77 bpm. Exam Location:  [place]  Procedure: 2D Echo, Cardiac Doppler and Color Doppler  Indications:    I48.0 Atrial Fibrillation  History:        Patient has no prior history of Echocardiogram examinations. TIA, Arrythmias:Atrial Fibrillation; Risk Factors:Hypertension, Dyslipidemia, Family History of Coronary Artery Disease and Former Smoker. Palpitations.  Sonographer:    Deliah Boston RDCS Referring Phys: [facility] A Jonael Paradiso  IMPRESSIONS   1. Left ventricular ejection fraction, by estimation, is >75%. The left ventricle has hyperdynamic function. The left ventricle has no regional wall motion abnormalities. Left ventricular diastolic parameters were normal. 2. Right ventricular systolic function is normal. The right ventricular size is normal. There is normal pulmonary artery systolic pressure. The estimated right ventricular systolic pressure is 123456 mmHg. 3. The mitral valve is grossly normal. Trivial mitral valve regurgitation. 4. The aortic valve is tricuspid. Aortic valve regurgitation is not visualized. 5. The inferior vena cava is normal in size with greater than 50% respiratory variability, suggesting right atrial pressure of 3 mmHg.  Comparison(s): No prior Echocardiogram.  FINDINGS Left Ventricle: Left ventricular ejection fraction, by estimation, is >75%. The left ventricle has hyperdynamic function. The left ventricle has no regional wall motion abnormalities. The left ventricular internal cavity size was normal in size. There is no left ventricular hypertrophy. Left ventricular diastolic parameters were normal.  Right  Ventricle: The right ventricular size is normal. No increase in right ventricular wall thickness. Right ventricular systolic function is normal. There is normal pulmonary artery systolic pressure. The tricuspid regurgitant velocity is 2.66 m/s, and with an assumed right atrial pressure of 3 mmHg, the estimated right ventricular systolic pressure is 123456 mmHg.  Left Atrium: Left atrial size was normal in size.  Right Atrium: Right atrial size was normal in size.  Pericardium: There is no evidence of pericardial effusion.  Mitral Valve: The mitral valve is  grossly normal. Trivial mitral valve regurgitation.  Tricuspid Valve: The tricuspid valve is grossly normal. Tricuspid valve regurgitation is trivial.  Aortic Valve: The aortic valve is tricuspid. Aortic valve regurgitation is not visualized. Aortic valve mean gradient measures 7.0 mmHg. Aortic valve peak gradient measures 13.2 mmHg. Aortic valve area, by VTI measures 1.85 cm.  Pulmonic Valve: The pulmonic valve was normal in structure. Pulmonic valve regurgitation is not visualized.  Aorta: The aortic root and ascending aorta are structurally normal, with no evidence of dilitation.  Venous: The inferior vena cava is normal in size with greater than 50% respiratory variability, suggesting right atrial pressure of 3 mmHg.  IAS/Shunts: No atrial level shunt detected by color flow Doppler.   LEFT VENTRICLE PLAX 2D LVIDd:         4.90 cm   Diastology LVIDs:         2.60 cm   LV e' medial:    12.60 cm/s LV PW:         0.90 cm   LV E/e' medial:  7.1 LV IVS:        0.80 cm   LV e' lateral:   18.80 cm/s LVOT diam:     2.00 cm   LV E/e' lateral: 4.8 LV SV:         61 LV SV Index:   28 LVOT Area:     3.14 cm   RIGHT VENTRICLE RV Basal diam:  3.20 cm RV S prime:     15.00 cm/s TAPSE (M-mode): 3.3 cm  LEFT ATRIUM             Index        RIGHT ATRIUM           Index LA diam:        4.10 cm 1.88 cm/m   RA Area:     14.50 cm LA Vol  (A2C):   48.0 ml 21.98 ml/m  RA Volume:   32.20 ml  14.75 ml/m LA Vol (A4C):   31.3 ml 14.34 ml/m LA Biplane Vol: 39.5 ml 18.09 ml/m AORTIC VALVE AV Area (Vmax):    1.90 cm AV Area (Vmean):   1.84 cm AV Area (VTI):     1.85 cm AV Vmax:           182.00 cm/s AV Vmean:          121.000 cm/s AV VTI:            0.330 m AV Peak Grad:      13.2 mmHg AV Mean Grad:      7.0 mmHg LVOT Vmax:         110.00 cm/s LVOT Vmean:        70.950 cm/s LVOT VTI:          0.194 m LVOT/AV VTI ratio: 0.59  AORTA Ao Root diam: 3.10 cm Ao Asc diam:  2.90 cm  MITRAL VALVE               TRICUSPID VALVE MV Area (PHT): cm         TR Peak grad:   28.3 mmHg MV Decel Time: 197 msec    TR Vmax:        266.00 cm/s MV E velocity: 90.05 cm/s MV A velocity: 73.90 cm/s  SHUNTS MV E/A ratio:  1.22        Systemic VTI:  0.19 m Systemic Diam: 2.00 cm  Lyman Bishop MD Electronically signed by Lyman Bishop MD  Signature Date/Time: 11/29/2021/8:39:27 PM    Final    MONITORS  CARDIAC EVENT MONITOR 11/21/2021  Narrative   Patient had a minimum heart rate of 54 bpm, maximum heart rate of 160 bpm, and average heart rate of 79 bpm.   Predominant underlying rhythm was sinus rhythm.   Rare atrial fibrillation RVR (7 minutes).   Isolated PACs were rare (<1.0%).   Isolated PVCs were rare (<1.0%).   Triggered and diary events associated with A fib and SR.  Rare atrial fibrillation.   CT SCANS  CT CARDIAC SCORING (SELF PAY ONLY) 09/18/2015  Narrative CLINICAL DATA:  Essential hypertension. Family history of heart disease. Nonsmoker.  EXAM: CT HEART FOR CALCIUM SCORING  TECHNIQUE: CT heart was performed on a 64 channel system using prospective ECG gating.  A non-contrast exam for calcium scoring was performed.  Note that this exam targets the heart and the chest was not imaged in its entirety.  COMPARISON:  None.  FINDINGS: Technical quality: Good.  CORONARY CALCIUM  Total Agatston Score:  0  OTHER  FINDINGS:  Cardiovascular: Normal heart size.  Normal aortic caliber.  Mediastinum/Nodes: No imaged thoracic adenopathy, given limitations of noncontrast technique.  Lungs/Pleura: No pleural fluid.  Clear lungs.  Upper Abdomen: Moderate hepatic steatosis. Normal imaged portions of the spleen, stomach.  Musculoskeletal: No acute osseous abnormality.  IMPRESSION: 1. No coronary calcium. 2. Moderate hepatic steatosis. 3.  No acute process in the chest.   Electronically Signed By: Abigail Miyamoto M.D. On: 09/18/2015 13:55            Recent Labs: 06/08/2021: ALT 52; BUN 22; Creatinine, Ser 0.99; Potassium 3.7; Sodium 139 11/29/2021: Hemoglobin 14.7; Platelets 237  Recent Lipid Panel No results found for: "CHOL", "TRIG", "HDL", "CHOLHDL", "VLDL", "LDLCALC", "LDLDIRECT"       Physical Exam:    VS:  BP 108/68   Pulse 75   Ht 6\' 1"  (1.854 m)   Wt 198 lb 9.6 oz (90.1 kg)   SpO2 96%   BMI 26.20 kg/m     Wt Readings from Last 3 Encounters:  04/25/22 198 lb 9.6 oz (90.1 kg)  11/03/21 207 lb (93.9 kg)  10/06/21 202 lb (91.6 kg)    Gen: No distress   Neck: No JVD Cardiac: No Rubs or Gallops, No murmur, RRR +2 radial pulses Respiratory: Clear to auscultation bilaterally, normal effort, normal  respiratory rate GI: Soft, nontender, non-distended  MS: No  edema;  moves all extremities Integument: Skin feels warm Neuro:  At time of evaluation, alert and oriented to person/place/time/situation  Psych: Normal affect, patient feels well   ASSESSMENT:    1. Paroxysmal atrial fibrillation   2. Daytime somnolence   3. History of TIA (transient ischemic attack)   4. Essential hypertension   5. Mixed hyperlipidemia     PLAN:    PAF With history of HTN, HLD, and TIA - CHADSVASC 3 - diltiazem as a PRN - on DOAC - DOAC no ASA - we discussed more aggressive f/u vs more conservative - we will add diltiazem 120 mg XL and cut losartan to 25 mg; BMP in 2-3 weeks -  if hypotension, can stop losartan - at lab f/u will ask him to check in with his sx; If worse will plan stress test for 1C start vs EP eval for early ablation (ideally will wait until sleep study eval   Daytime Somnolence RESULT SUMMARY: Planned for home sleep study 4 points STOP-BANG Intermediate  Risk for  moderate to severe OSA INPUTS: Do you snore loudly? --> 1 = Yes Do you often feel tired, fatigued, or sleepy during the daytime? --> 1 = Yes Has anyone observed you stop breathing during sleep? --> 0 = No Do you have (or are you being treated for) high blood pressure? --> 1 = Yes BMI --> 0 = ?35 kg/m Age --> 0 = ?49 years Neck circumference --> 0 = ?40 cm Gender --> 1 = Male  HLD hx of TIA - LDL is near 70 goal on current statin no change for now  Six months with me         Medication Adjustments/Labs and Tests Ordered: Current medicines are reviewed at length with the patient today.  Concerns regarding medicines are outlined above.  Orders Placed This Encounter  Procedures   Basic metabolic panel   [facility] Sleep Study   Meds ordered this encounter  Medications   diltiazem (CARDIZEM CD) 120 MG 24 hr capsule    Sig: Take 1 capsule (120 mg total) by mouth daily.    Dispense:  90 capsule    Refill:  3   losartan (COZAAR) 25 MG tablet    Sig: Take 1 tablet (25 mg total) by mouth daily.    Dispense:  90 tablet    Refill:  3    Patient Instructions  Medication Instructions:  Your physician has recommended you make the following change in your medication:  DECREASE: losartan to 25 mg by mouth once daily  START: diltiazem (Cardizem) 120 mg by mouth once daily  REMOVED: furosemide (Lasix) from medication list *If you need a refill on your cardiac medications before your next appointment, please call your pharmacy*   Lab Work: IN 2-3 WEEKS: BMP If you have labs (blood work) drawn today and your tests are completely normal, you will receive your results only  by: [place] (if you have MyChart) OR A paper copy in the mail If you have any lab test that is abnormal or we need to change your treatment, we will call you to review the results.   Testing/Procedures: Your physician has requested that you have a sleep study.   Follow-Up: At [facility], you and your health needs are our priority.  As part of our continuing mission to provide you with exceptional heart care, we have created designated Provider Care Teams.  These Care Teams include your primary Cardiologist (physician) and Advanced Practice Providers (APPs -  Physician Assistants and Nurse Practitioners) who all work together to provide you with the care you need, when you need it.  We recommend signing up for the patient portal called "MyChart".  Sign up information is provided on this After Visit Summary.  MyChart is used to connect with patients for Virtual Visits (Telemedicine).  Patients are able to view lab/test results, encounter notes, upcoming appointments, etc.  Non-urgent messages can be sent to your provider as well.   To learn more about what you can do with MyChart, go to [email or web address].    Your next appointment:   6 month(s)  Provider:   Werner Lean, MD       Signed, Werner Lean, MD  04/25/2022 9:31 AM    [place]

## 2022-04-25 NOTE — Telephone Encounter (Signed)
Patient was given [facility] sleep study in office today, ordered by Dr. Gasper Sells. He is aware not to open until approved by insurance and we call with a pin#.

## 2022-04-25 NOTE — Patient Instructions (Signed)
Medication Instructions:  Your physician has recommended you make the following change in your medication:  DECREASE: losartan to 25 mg by mouth once daily  START: diltiazem (Cardizem) 120 mg by mouth once daily  REMOVED: furosemide (Lasix) from medication list *If you need a refill on your cardiac medications before your next appointment, please call your pharmacy*   Lab Work: IN 2-3 WEEKS: BMP If you have labs (blood work) drawn today and your tests are completely normal, you will receive your results only by: [place] (if you have MyChart) OR A paper copy in the mail If you have any lab test that is abnormal or we need to change your treatment, we will call you to review the results.   Testing/Procedures: Your physician has requested that you have a sleep study.   Follow-Up: At [facility], you and your health needs are our priority.  As part of our continuing mission to provide you with exceptional heart care, we have created designated Provider Care Teams.  These Care Teams include your primary Cardiologist (physician) and Advanced Practice Providers (APPs -  Physician Assistants and Nurse Practitioners) who all work together to provide you with the care you need, when you need it.  We recommend signing up for the patient portal called "MyChart".  Sign up information is provided on this After Visit Summary.  MyChart is used to connect with patients for Virtual Visits (Telemedicine).  Patients are able to view lab/test results, encounter notes, upcoming appointments, etc.  Non-urgent messages can be sent to your provider as well.   To learn more about what you can do with MyChart, go to [email or web address].    Your next appointment:   6 month(s)  Provider:   Werner Lean, MD

## 2022-05-02 NOTE — Telephone Encounter (Signed)
Prior Authorization for [facility] sent to [facility] via web portal. Tracking Number . [id number]

## 2022-05-02 NOTE — Telephone Encounter (Signed)
Called and made the patient aware that he may proceed with the [facility] Sleep Study. PIN # provided to the patient. Patient made aware that he will be contacted after the test has been read with the results and any recommendations. Patient verbalized understanding and thanked me for the call.   Pt will do sleep study by this weekend. Pt has been given PIN# [phone].

## 2022-05-16 ENCOUNTER — Ambulatory Visit: Payer: 59 | Attending: Internal Medicine

## 2022-05-16 DIAGNOSIS — R400 Somnolence: Secondary | ICD-10-CM

## 2022-05-16 DIAGNOSIS — I480 Paroxysmal atrial fibrillation: Secondary | ICD-10-CM

## 2022-05-17 ENCOUNTER — Encounter: Payer: Self-pay | Admitting: Internal Medicine

## 2022-05-17 LAB — BASIC METABOLIC PANEL
BUN/Creatinine Ratio: 24 — ABNORMAL HIGH (ref 9–20)
BUN: 23 mg/dL (ref 6–24)
CO2: 20 mmol/L (ref 20–29)
Calcium: 9.4 mg/dL (ref 8.7–10.2)
Chloride: 105 mmol/L (ref 96–106)
Creatinine, Ser: 0.94 mg/dL (ref 0.76–1.27)
Glucose: 89 mg/dL (ref 70–99)
Potassium: 4.3 mmol/L (ref 3.5–5.2)
Sodium: 144 mmol/L (ref 134–144)
eGFR: 95 mL/min/{1.73_m2} (ref >59)

## 2022-05-19 ENCOUNTER — Encounter (INDEPENDENT_AMBULATORY_CARE_PROVIDER_SITE_OTHER): Payer: 59 | Admitting: Cardiology

## 2022-05-19 DIAGNOSIS — G4733 Obstructive sleep apnea (adult) (pediatric): Secondary | ICD-10-CM | POA: Diagnosis not present

## 2022-05-20 ENCOUNTER — Ambulatory Visit: Payer: 59 | Attending: Internal Medicine

## 2022-05-20 DIAGNOSIS — I480 Paroxysmal atrial fibrillation: Secondary | ICD-10-CM

## 2022-05-20 DIAGNOSIS — R400 Somnolence: Secondary | ICD-10-CM

## 2022-05-20 NOTE — Procedures (Signed)
SLEEP STUDY REPORT Patient Information Study Date: 05/19/2022 Patient Name: Steven Le Patient ID: [id number] Birth Date: 1965-04-12 Age: 56 Gender:  BMI: 26.3 (W=198 lb, H=6' 1'') Referring Physician: Riley Lam, MD  TEST DESCRIPTION: Home sleep apnea testing was completed using the WatchPat, a Type 1 device, utilizing peripheral arterial tonometry (PAT), chest movement, actigraphy, pulse oximetry, pulse rate, body position and snore. AHI was calculated with apnea and hypopnea using valid sleep time as the denominator. RDI includes apneas, hypopneas, and RERAs. The data acquired and the scoring of sleep and all associated events were performed in accordance with the recommended standards and specifications as outlined in the AASM Manual for the Scoring of Sleep and Associated Events 2.2.0 (2015).   FINDINGS:   1. Mild Obstructive Sleep Apnea with AHI 9/hr. but during REM sleep it is moderate with REM AI 17.2/hr.  2. No Central Sleep Apnea with pAHIc 0.1/hr.   3. Oxygen desaturations as low as 84%.   4. Minimal snoring was present. O2 sats were < 88% for 1 min.   5. Total sleep time was 7 hrs and 38 min.   6. 27.3% of total sleep time was spent in REM sleep.   7. Normal sleep onset latency at 25 min.   8. Shortened REM sleep onset latency at 50 min.   9. Total awakenings were 6.  10. Arrhythmia detection: None .  DIAGNOSIS: Mild Obstructive Sleep Apnea (G47.33)  RECOMMENDATIONS:   1.  Clinical correlation of these findings is necessary.  The decision to treat obstructive sleep apnea (OSA) is usually based on the presence of apnea symptoms or the presence of associated medical conditions such as Hypertension, Congestive Heart Failure, Atrial Fibrillation or Obesity.  The most common symptoms of OSA are snoring, gasping for breath while sleeping, daytime sleepiness and fatigue.   2.  Initiating apnea therapy is recommended given the presence of symptoms and/or  associated conditions. Recommend proceeding with one of the following:     a.  Auto-CPAP therapy with a pressure range of 5-20cm H2O.     b.  An oral appliance (OA) that can be obtained from certain dentists with expertise in sleep medicine.  These are primarily of use in non-obese patients with mild and moderate disease.     c.  An ENT consultation which may be useful to look for specific causes of obstruction and possible treatment options.     d.  If patient is intolerant to PAP therapy, consider referral to ENT for evaluation for hypoglossal nerve stimulator.   3.  Close follow-up is necessary to ensure success with CPAP or oral appliance therapy for maximum benefit.  4.  A follow-up oximetry study on CPAP is recommended to assess the adequacy of therapy and determine the need for supplemental oxygen or the potential need for Bi-level therapy.  An arterial blood gas to determine the adequacy of baseline ventilation and oxygenation should also be considered.  5.  Healthy sleep recommendations include:  adequate nightly sleep (normal 7-9 hrs/night), avoidance of caffeine after noon and alcohol near bedtime, and maintaining a sleep environment that is cool, dark and quiet.  6.  Weight loss for overweight patients is recommended.  Even modest amounts of weight loss can significantly improve the severity of sleep apnea.  7.  Snoring recommendations include:  weight loss where appropriate, side sleeping, and avoidance of alcohol before bed.  8.  Operation of motor vehicle should be avoided when sleepy.  Signature:   Armanda Magic, MD; [facility]; Diplomat, American Board of Sleep Medicine Electronically Signed: 05/20/2022 4:51:52 PM

## 2022-05-25 ENCOUNTER — Telehealth: Payer: Self-pay | Admitting: *Deleted

## 2022-05-25 DIAGNOSIS — R400 Somnolence: Secondary | ICD-10-CM

## 2022-05-25 DIAGNOSIS — I10 Essential (primary) hypertension: Secondary | ICD-10-CM

## 2022-05-25 DIAGNOSIS — G4733 Obstructive sleep apnea (adult) (pediatric): Secondary | ICD-10-CM

## 2022-05-25 NOTE — Telephone Encounter (Signed)
The patient has been notified of the result. Left detailed message on voicemail and informed patient to call back..Arleigh Dicola Green, CMA   

## 2022-05-25 NOTE — Telephone Encounter (Signed)
Return call: The patient has been notified of the result and verbalized understanding.  All questions (if any) were answered. Steven Le, CMA 05/25/2022 2:33 PM   Upon patient request DME selection is [facility]. Patient understands he will be contacted by [facility] to set up his cpap. Patient understands to call if [facility] does not contact him with new setup in a timely manner. Patient understands they will be called once confirmation has been received from [facility]/ that they have received their new machine to schedule 10 week follow up appointment.   [facility] notified of new cpap order  Please add to airview Patient was grateful for the call and thanked me.

## 2022-05-25 NOTE — Telephone Encounter (Signed)
-----   Message from Gaynelle Cage, CMA sent at 05/23/2022  8:00 AM EDT -----  ----- Message ----- From: Quintella Reichert, MD Sent: 05/20/2022   4:53 PM EDT To: [facility] Sleep Studies  Please let patient know that they have sleep apnea and recommend treating with CPAP.  Please order an auto CPAP from 4-15cm H2O with heated humidity and mask of choice.  Order overnight pulse ox on CPAP.  Followup with me in 6 weeks.

## 2022-10-22 ENCOUNTER — Other Ambulatory Visit: Payer: Self-pay | Admitting: Student

## 2022-10-22 DIAGNOSIS — I480 Paroxysmal atrial fibrillation: Secondary | ICD-10-CM

## 2022-10-24 NOTE — Telephone Encounter (Signed)
Eliquis 5mg  refill request received. Patient is 57 years old, weight-90.1kg, Crea-0.94 on 05/16/22, Diagnosis-Afib, and last seen by Dr. Izora Ribas on 04/25/22. Dose is appropriate based on dosing criteria. Will send in refill to requested pharmacy.

## 2023-02-12 ENCOUNTER — Other Ambulatory Visit: Payer: Self-pay | Admitting: Internal Medicine

## 2023-02-27 ENCOUNTER — Ambulatory Visit: Payer: 59 | Attending: Cardiology | Admitting: Cardiology

## 2023-02-27 VITALS — HR 86 | Ht 73.0 in | Wt 196.0 lb

## 2023-02-27 DIAGNOSIS — G4733 Obstructive sleep apnea (adult) (pediatric): Secondary | ICD-10-CM

## 2023-02-27 DIAGNOSIS — I10 Essential (primary) hypertension: Secondary | ICD-10-CM

## 2023-02-27 NOTE — Progress Notes (Signed)
SLEEP MEDICINE VIRTUAL CONSULT NOTE via Video Note   Because of Jamear Andrzejewski's co-morbid illnesses, he is at least at moderate risk for complications without adequate follow up.  This format is felt to be most appropriate for this patient at this time.  All issues noted in this document were discussed and addressed.  A limited physical exam was performed with this format.  Please refer to the patient's chart for his consent to telehealth for [facility].      Date:  02/27/2023   ID:  Alma Downs, DOB June 29, 1965, MRN [id number] The patient was identified using 2 identifiers.  Patient Location: Home Provider Location: Home Office   PCP:  Tally Joe, MD   [facility] Providers Cardiologist:  Christell Constant, MD     Evaluation Performed:  New Patient Evaluation  Chief Complaint:  OSA  History of Present Illness:    Beldon Nowling is a 58 y.o. male who is being seen today for the evaluation of OSA at the request of Riley Lam, MD.  Kathan Kirker is a 58 y.o. male with a hx of HLD, HTN and PAF.  He was seen by Dr. Izora Ribas in April 2024 and due to afib, a sleep study was ordered which showed mild OSA overall with ah AHI of 9/hr but moderate during REM sleep with a REM AHI of 17/hr.  He was started on auto CPAP from 4 to 15cm H2O and is now referred for Sleep Medicine consultation to establish sleep care and treatment of OSA.   He has had a lot of problems with CPAP and has really struggled.  He says that when he tries to exhale it is very uncomfortable. He feels he gets enough air when he breathes in.  He initially tried a nasal pillow mask but due to the resistance of exhale he was changed to a FFM but has not really tried it consistently and has not used his CPAP in the past month.   Past Medical History:  Diagnosis Date   ED (erectile dysfunction)    Facial basal cell cancer    Fatty infiltration of liver    GERD  (gastroesophageal reflux disease)    Hypercholesteremia    Hyperlipidemia    Hypertension    Hypogonadism in male    Low testosterone    Palpitations    Pre-diabetes    Tendonitis    No past surgical history on file.   Current Meds  Medication Sig   apixaban (ELIQUIS) 5 MG TABS tablet TAKE 1 TABLET(5 MG) BY MOUTH TWICE DAILY   diltiazem (CARDIZEM CD) 120 MG 24 hr capsule TAKE 1 CAPSULE(120 MG) BY MOUTH DAILY   diltiazem (CARDIZEM) 30 MG tablet Take 1 tablet (30 mg total) by mouth every 6 (six) hours as needed (palpitations).   losartan (COZAAR) 25 MG tablet Take 1 tablet (25 mg total) by mouth daily.   Multiple Vitamin (MULTIVITAMIN) tablet Take 1 tablet by mouth daily.   rosuvastatin (CRESTOR) 5 MG tablet Take 5 mg by mouth daily.     Allergies:   Cough & chest congestion dm [dextromethorphan-guaifenesin], Dextromethorphan hbr, and Niacin   Social History   Tobacco Use   Smoking status: Former    Types: Cigarettes   Smokeless tobacco: Never  Substance Use Topics   Alcohol use: Not Currently   Drug use: Never     Family Hx: The patient's family history includes Cancer - Colon (age of onset: 96) in  his father; Cancer - Prostate (age of onset: 39) in his father; Colon polyps in his brother; Diabetes in his father and mother; Healthy in his brother, daughter, sister, and son; Hypertension in his mother; Skin cancer in his mother.  ROS:   Please see the history of present illness.     All other systems reviewed and are negative.   Prior Sleep studies:   The following studies were reviewed today:  HST, PAP compliance download  Labs/Other Tests and Data Reviewed:     Recent Labs: 05/16/2022: BUN 23; Creatinine, Ser 0.94; Potassium 4.3; Sodium 144    Wt Readings from Last 3 Encounters:  02/27/23 196 lb (88.9 kg)  04/25/22 198 lb 9.6 oz (90.1 kg)  11/03/21 207 lb (93.9 kg)     Risk Assessment/Calculations:    CHA2DS2-VASc Score = 1   This indicates a 0.6%  annual risk of stroke. The patient's score is based upon: CHF History: 0 HTN History: 1 Diabetes History: 0 Stroke History: 0 Vascular Disease History: 0 Age Score: 0 Gender Score: 0          Objective:    Vital Signs:  Pulse 86   Ht 6\' 1"  (1.854 m)   Wt 196 lb (88.9 kg)   BMI 25.86 kg/m    VITAL SIGNS:  reviewed GEN:  no acute distress EYES:  sclerae anicteric, EOMI - Extraocular Movements Intact RESPIRATORY:  normal respiratory effort, symmetric expansion CARDIOVASCULAR:  no peripheral edema SKIN:  no rash, lesions or ulcers. MUSCULOSKELETAL:  no obvious deformities. NEURO:  alert and oriented x 3, no obvious focal deficit PSYCH:  normal affect  ASSESSMENT & PLAN:    OSA - The patient is tolerating PAP therapy well without any problems. The PAP download performed by his DME was personally reviewed and interpreted by me today and showed an AHI of 0/hr on auto PAP from 4 to 15 cm H2O with 0% compliance in using more than 4 hours nightly.  The patient has been using and benefiting from PAP use and will continue to benefit from therapy.  -he has not tolerated the auto CPAP due to issues with exhaling against a higher pressure on the auto CPAP.   -his 95th% pressure was 4cm H2O  -we discussed alternative options for OSA treatment including the oral device vs. Changing to a set pressure at a lower end and he would like to try the set pressure -I will change to CPAP at 6cm H2O -get download in 4 weeks  HTN -BP controlled at home -continue prescription drug management with Cardizem CD 120mg  daily and Losartan 25mg  daily with PRN refills  Time:   Today, I have spent 20 minutes with the patient with telehealth technology discussing the above problems.     Medication Adjustments/Labs and Tests Ordered: Current medicines are reviewed at length with the patient today.  Concerns regarding medicines are outlined above.   Tests Ordered: No orders of the defined types were placed  in this encounter.   Medication Changes: No orders of the defined types were placed in this encounter.   Follow Up:  In Person in 6 week(s)  Signed, Armanda Magic, MD  02/27/2023 8:06 AM    [facility]

## 2023-02-27 NOTE — Patient Instructions (Signed)
Medication Instructions:  Your physician recommends that you continue on your current medications as directed. Please refer to the Current Medication list given to you today.  *If you need a refill on your cardiac medications before your next appointment, please call your pharmacy*   Lab Work: None.  If you have labs (blood work) drawn today and your tests are completely normal, you will receive your results only by: MyChart Message (if you have MyChart) OR A paper copy in the mail If you have any lab test that is abnormal or we need to change your treatment, we will call you to review the results.   Testing/Procedures: None.   Follow-Up:  Your next appointment will be a VIRTUAL VISIT in:   6 week(s)  Provider:   Dr. Armanda Magic, MD   Other Instructions Dr. Mayford Knife has sent orders to your DME company. Someone should contact you regarding these orders.

## 2023-03-09 ENCOUNTER — Other Ambulatory Visit: Payer: Self-pay

## 2023-03-09 DIAGNOSIS — Z8673 Personal history of transient ischemic attack (TIA), and cerebral infarction without residual deficits: Secondary | ICD-10-CM

## 2023-03-09 DIAGNOSIS — I10 Essential (primary) hypertension: Secondary | ICD-10-CM

## 2023-03-09 DIAGNOSIS — R002 Palpitations: Secondary | ICD-10-CM

## 2023-03-09 DIAGNOSIS — R400 Somnolence: Secondary | ICD-10-CM

## 2023-03-09 DIAGNOSIS — I480 Paroxysmal atrial fibrillation: Secondary | ICD-10-CM

## 2023-03-09 DIAGNOSIS — G4733 Obstructive sleep apnea (adult) (pediatric): Secondary | ICD-10-CM

## 2023-03-09 DIAGNOSIS — E782 Mixed hyperlipidemia: Secondary | ICD-10-CM

## 2023-03-09 DIAGNOSIS — I2510 Atherosclerotic heart disease of native coronary artery without angina pectoris: Secondary | ICD-10-CM

## 2023-03-09 NOTE — Progress Notes (Signed)
Order to change PAP from auto to CPAP at 6cm H2O and get a download in 4 weeks sent to [employer] today 03/09/23

## 2023-04-17 ENCOUNTER — Ambulatory Visit: Payer: 59 | Admitting: Cardiology

## 2023-04-27 NOTE — Progress Notes (Unsigned)
 SLEEP MEDICINE VIRTUAL  NOTE via Video Note   Because of Steven Le's co-morbid illnesses, he is at least at moderate risk for complications without adequate follow up.  This format is felt to be most appropriate for this patient at this time.  All issues noted in this document were discussed and addressed.  A limited physical exam was performed with this format.  Please refer to the patient's chart for his consent to telehealth for Steven Le.      Date:  04/28/2023   ID:  Steven Le, DOB [birth year], MRN [id number] The patient was identified using 2 identifiers.  Patient Location: Home Provider Location: Home Office   PCP:  Tally Joe, MD   [facility] Providers Cardiologist:  Christell Constant, MD     Evaluation Performed:  New Patient Evaluation  Chief Complaint:  OSA  History of Present Illness:    Steven Le is a 58 y.o. male with a hx of HLD, HTN and PAF.  He was seen by Dr. Izora Ribas in April 2024 and due to afib, a sleep study was ordered which showed mild OSA overall with ah AHI of 9/hr but moderate during REM sleep with a REM AHI of 17/hr.  He was started on auto CPAP from 4 to 15cm H2O   At last OV he was not tolerating an auto pressure as he felt it was too much pressure to exhale against. HIs 95% pressure was 4cm H2O so he his CPAP was changed to 6cm H2O.  He is now back for followup.   He has not really been using his device because he has not gotten the pressure changed yet.  He has been having some other medical issues with abdominal pain and is currently getting a workup done.   Past Medical History:  Diagnosis Date   ED (erectile dysfunction)    Facial basal cell cancer    Fatty infiltration of liver    GERD (gastroesophageal reflux disease)    Hypercholesteremia    Hyperlipidemia    Hypertension    Hypogonadism in male    Low testosterone    Palpitations    Pre-diabetes    Tendonitis    No past  surgical history on file.   Current Meds  Medication Sig   apixaban (ELIQUIS) 5 MG TABS tablet TAKE 1 TABLET(5 MG) BY MOUTH TWICE DAILY   diltiazem (CARDIZEM CD) 120 MG 24 hr capsule TAKE 1 CAPSULE(120 MG) BY MOUTH DAILY   diltiazem (CARDIZEM) 30 MG tablet Take 1 tablet (30 mg total) by mouth every 6 (six) hours as needed (palpitations).   losartan (COZAAR) 25 MG tablet Take 1 tablet (25 mg total) by mouth daily.   Multiple Vitamin (MULTIVITAMIN) tablet Take 1 tablet by mouth daily.   rosuvastatin (CRESTOR) 5 MG tablet Take 5 mg by mouth daily.   sildenafil (VIAGRA) 25 MG tablet Take 25 mg by mouth as needed for erectile dysfunction.     Allergies:   Cough & chest congestion dm [dextromethorphan-guaifenesin], Dextromethorphan hbr, and Niacin   Social History   Tobacco Use   Smoking status: Former    Types: Cigarettes   Smokeless tobacco: Never  Substance Use Topics   Alcohol use: Not Currently   Drug use: Never     Family Hx: The patient's family history includes Cancer - Colon (age of onset: 9) in his father; Cancer - Prostate (age of onset: 60) in his father; Colon polyps in his brother;  Diabetes in his father and mother; Healthy in his brother, daughter, sister, and son; Hypertension in his mother; Skin cancer in his mother.  ROS:   Please see the history of present illness.     All other systems reviewed and are negative.   Prior Sleep studies:   The following studies were reviewed today:  HST, PAP compliance download  Labs/Other Tests and Data Reviewed:     Recent Labs: 05/16/2022: BUN 23; Creatinine, Ser 0.94; Potassium 4.3; Sodium 144    Wt Readings from Last 3 Encounters:  04/28/23 196 lb (88.9 kg)  02/27/23 196 lb (88.9 kg)  04/25/22 198 lb 9.6 oz (90.1 kg)     Risk Assessment/Calculations:    CHA2DS2-VASc Score = 1   This indicates a 0.6% annual risk of stroke. The patient's score is based upon: CHF History: 0 HTN History: 1 Diabetes History:  0 Stroke History: 0 Vascular Disease History: 0 Age Score: 0 Gender Score: 0          Objective:    Vital Signs:  Ht 6' (1.829 m)   Wt 196 lb (88.9 kg)   BMI 26.58 kg/m   Well nourished, well developed male in no acute distress. Well appearing, alert and conversant, regular work of breathing,  good skin color  Eyes- anicteric mouth- oral mucosa is pink  neuro- grossly intact skin- no apparent rash or lesions or cyanosis ASSESSMENT & PLAN:    OSA  -he has not been using his CPAP device because the pressure has not been changed yet and he has been having some other health issues with some abdominal pain and is getting a workup. -he has an appt with DME to get his pressure changed soon and will send me a mychart message to let me know how he likes the new pressure -we did talk about the oral device again today and if the CPAP settings do not work for him then will refer to Irene Limbo DDS with Sleep dentistry  HTN -BP controlled at home -continue prescription drug management with Cardizem CD 120mg  daily and Losartan 25mg  daily with PRN refills  Time:   Today, I have spent 10 minutes with the patient with telehealth technology discussing the above problems.     Medication Adjustments/Labs and Tests Ordered: Current medicines are reviewed at length with the patient today.  Concerns regarding medicines are outlined above.   Tests Ordered: No orders of the defined types were placed in this encounter.   Medication Changes: No orders of the defined types were placed in this encounter.   Follow Up:  In Person 2 months  Signed, Armanda Magic, MD  04/28/2023 8:03 AM    [facility]

## 2023-04-28 ENCOUNTER — Ambulatory Visit: Attending: Cardiology | Admitting: Cardiology

## 2023-04-28 VITALS — Ht 72.0 in | Wt 196.0 lb

## 2023-04-28 DIAGNOSIS — G4733 Obstructive sleep apnea (adult) (pediatric): Secondary | ICD-10-CM | POA: Diagnosis not present

## 2023-04-28 DIAGNOSIS — I10 Essential (primary) hypertension: Secondary | ICD-10-CM

## 2023-04-28 NOTE — Patient Instructions (Signed)
 Medication Instructions:   *If you need a refill on your cardiac medications before your next appointment, please call your pharmacy*  Lab Work:  If you have labs (blood work) drawn today and your tests are completely normal, you will receive your results only by: MyChart Message (if you have MyChart) OR A paper copy in the mail If you have any lab test that is abnormal or we need to change your treatment, we will call you to review the results.  Testing/Procedures:   Follow-Up: At [facility], you and your health needs are our priority.  As part of our continuing mission to provide you with exceptional heart care, our providers are all part of one team.  This team includes your primary Cardiologist (physician) and Advanced Practice Providers or APPs (Physician Assistants and Nurse Practitioners) who all work together to provide you with the care you need, when you need it.  Your next appointment:   8 week(s)  Provider:  Dr Armanda Magic for a virtual visit   We recommend signing up for the patient portal called "MyChart".  Sign up information is provided on this After Visit Summary.  MyChart is used to connect with patients for Virtual Visits (Telemedicine).  Patients are able to view lab/test results, encounter notes, upcoming appointments, etc.  Non-urgent messages can be sent to your provider as well.   To learn more about what you can do with MyChart, go to [email or web address].   Other Instructions       1st Floor: - Lobby - Registration  - Pharmacy  - Lab - Cafe  2nd Floor: - PV Lab - Diagnostic Testing (echo, CT, nuclear med)  3rd Floor: - Vacant  4th Floor: - TCTS (cardiothoracic surgery) - AFib Clinic - Structural Heart Clinic - Vascular Surgery  - Vascular Ultrasound  5th Floor: - HeartCare Cardiology (general and EP) - Clinical Pharmacy for coumadin, hypertension, lipid, weight-loss medications, and med management appointments    Valet  parking services will be available as well.

## 2023-04-30 ENCOUNTER — Other Ambulatory Visit: Payer: Self-pay | Admitting: Internal Medicine

## 2023-04-30 DIAGNOSIS — I480 Paroxysmal atrial fibrillation: Secondary | ICD-10-CM

## 2023-05-01 NOTE — Telephone Encounter (Signed)
 Prescription refill request for Eliquis received. Indication:afib Last office visit:4/25 Scr:0.94  4/24 Age: 58 Weight:88.9  kg  Prescription refilled

## 2023-05-04 ENCOUNTER — Other Ambulatory Visit: Payer: Self-pay | Admitting: Student

## 2023-05-04 ENCOUNTER — Telehealth: Payer: Self-pay

## 2023-05-04 DIAGNOSIS — R109 Unspecified abdominal pain: Secondary | ICD-10-CM

## 2023-05-04 NOTE — Telephone Encounter (Signed)
   Name: Jamarkis Branam  DOB: 28-Jan-1965  MRN: [id number]  Primary Cardiologist: Christell Constant, MD  Chart reviewed as part of pre-operative protocol coverage. The patient has an upcoming visit scheduled with Dr. Mayford Knife on 06/30/2023 at which time clearance can be addressed in case there are any issues that would impact surgical recommendations.   I added preop FYI to appointment note so that provider is aware to address at time of outpatient visit.  Per office protocol the cardiology provider should forward their finalized clearance decision and recommendations regarding antiplatelet therapy to the requesting party below.    I will route this message as FYI to requesting party and remove this message from the preop box as separate preop APP input not needed at this time.   Please call with any questions.  Napoleon Form, Leodis Rains, NP  05/04/2023, 5:22 PM

## 2023-05-04 NOTE — Telephone Encounter (Addendum)
   Pre-operative Risk Assessment    Patient Name: Steven Le  DOB: [birth year] MRN: [id number]   Date of last office visit: 04/25/2022 with Dr. Izora Ribas Date of next office visit: None   Request for Surgical Clearance    Procedure:   Colonoscopy  Date of Surgery:  Clearance TBD - June 2025                                Surgeon:  Dr. Charlott Rakes Surgeon's Group or Practice Name:  [facility]  Phone number:  [phone] Fax number:  [phone]   Type of Clearance Requested:   - Medical  - Pharmacy:  Hold Apixaban (Eliquis) 2 days   Type of Anesthesia:   Propofol   Additional requests/questions:    Robley Fries   05/04/2023, 2:16 PM

## 2023-05-10 ENCOUNTER — Telehealth: Payer: Self-pay

## 2023-05-10 NOTE — Telephone Encounter (Signed)
 I will confirm with pre op APP if pt needs a tele appt.

## 2023-05-10 NOTE — Telephone Encounter (Signed)
 Pharmacy please advise on holding Eliquis prior to colonoscopy scheduled for 06/27/2023. Thank you.

## 2023-05-10 NOTE — Telephone Encounter (Signed)
 Tried to call PCP office to check if they have any recent labs  for the pt; though I was unable to get through their phone line. I will try again in the morning to see if I can reach PCP office before calling the pt to set up labs.    I was able to find labs in Care Every Where from [employer]:   Comp Metabolic Panel Reviewed date:05/08/2023 01:12:59 PM Interpretation:Stable Performing Lab: Notes/Report: Testing Performed at: [facility], [address], [address], [place], [place] [address]  Glucose 82 70-99 mg/dL    BUN 21 6-21 mg/dL    Creatinine 3.08 6.57-8.46 mg/dl    [id number] >13 calc In accordance with recommendations from NKF-ASN Task Force, [facility] has updated its eGFR calc to the 2021 CKD-EDI equation that estimates kidney function without a race variable;Stage 1 > 90 ML/Min plus Albuminuria;Stage 2 60-89 ML/MIN;Stage 3 30-59 ML/MIN;Stage 4 15-29 ML/MIN;Stage 5 <15 ML/MIN  Sodium 140 136-145 mmol/L    Potassium 4.2 3.5-5.5 mmol/L    Chloride 103 98-107 mmol/L    CO2 30 22-32 mmol/L    Anion Gap 11.4 6.0-20.0 mmol/L    Calcium 10.0 8.6-10.3 mg/dL    CA-corrected 2.44 0.10-27.25 mg/dL    Protein, Total 7.3 3.6-6.4 g/dL    Albumin 4.8 4.0-3.4 g/dL    TBIL 0.4 7.4-2.5 mg/dL    ALP 53 95-638 U/L    AST 31 0-39 U/L    ALT 61 0-52 U/L    CBC with Diff Reviewed date:05/08/2023 01:12:59 PM Interpretation:Stable Performing Lab: Notes/Report: Testing Performed at: [facility], [address], [place], [place] [address]  WBC 8.0 4.0-11.0 K/ul    RBC 5.32 4.20-5.80 M/uL    HGB 16.1 13.0-17.0 g/dL    HCT 32.9 51.8-84.1 %    MCV 88.5 80.0-94.0 fL    MCH 30.2 27.0-33.0 pg    MCHC 34.2 32.0-36.0 g/dL    RDW 66.0 63.0-16.0 %    PLT 276 150-400 K/uL

## 2023-05-10 NOTE — Telephone Encounter (Signed)
   Pre-operative Risk Assessment    Patient Name: Steven Le  DOB: 04/09/1965 MRN: [id number]    Date of last office visit: 04/25/2022 Lendel Quant, MD Date of next office visit: 06/30/2023 Micael Adas, MD   Request for Surgical Clearance    Procedure:   Colonscopy  Date of Surgery:  Clearance 06/27/23                                 Surgeon:   Dr. Baldo Bonds Surgeon's Group or Practice Name:  [facility] Phone number:  [phone] Fax number:  [phone]   Type of Clearance Requested:   - Medical  - Pharmacy:  Hold Apixaban (Eliquis) 2 days prior   Type of Anesthesia:   Propofol   Additional requests/questions:    Darell Echevaria   05/10/2023, 10:09 AM

## 2023-05-11 NOTE — Telephone Encounter (Signed)
   Name: Steven Le  DOB: [birth year]  MRN: [id number]  Primary Cardiologist: Jann Melody, MD   Preoperative team, please contact this patient and set up a phone call appointment for further preoperative risk assessment. Please obtain consent and complete medication review. Thank you for your help.  I confirm that guidance regarding antiplatelet and oral anticoagulation therapy has been completed and, if necessary, noted below.  Per office protocol, patient can hold Eliquis for 2 days prior to procedure.   Patient will not need bridging with Lovenox (enoxaparin) around procedure.  I also confirmed the patient resides in the state of [place] . As per [facility] Medical Board telemedicine laws, the patient must reside in the state in which the provider is licensed.   Francene Ing, Retha Cast, NP 05/11/2023, 7:38 AM [facility]

## 2023-05-11 NOTE — Telephone Encounter (Signed)
 Left message to call back to schedule a tele pre op appt.  ?

## 2023-05-11 NOTE — Telephone Encounter (Signed)
 Patient with diagnosis of Atrial fibrillation on Eliquis for anticoagulation.    Procedure:   Colonscopy   Date of Surgery:  Clearance 06/27/23    CHA2DS2-VASc Score = 3   This indicates a 3.2% annual risk of stroke. The patient's score is based upon: CHF History: 0 HTN History: 1 Diabetes History: 0 Stroke History: 2 Vascular Disease History: 0 Age Score: 0 Gender Score: 0   TiA - 05/2021  CrCl 110 Platelet count 276  Per office protocol, patient can hold Eliquis for 2 days prior to procedure.   Patient will not need bridging with Lovenox (enoxaparin) around procedure.  **This guidance is not considered finalized until pre-operative APP has relayed final recommendations.**

## 2023-05-12 NOTE — Telephone Encounter (Signed)
 Called patient no answer, 2nd attempt to schedule preop tele visit, LVMFCB

## 2023-05-15 ENCOUNTER — Telehealth: Payer: Self-pay

## 2023-05-15 NOTE — Telephone Encounter (Signed)
  Patient Consent for Virtual Visit        Steven Le has provided verbal consent on 05/15/2023 for a virtual visit (video or telephone).   CONSENT FOR VIRTUAL VISIT FOR:  Steven Le  By participating in this virtual visit I agree to the following:  I hereby voluntarily request, consent and authorize [facility] and its employed or contracted physicians, physician assistants, nurse practitioners or other licensed health care professionals (the Practitioner), to provide me with telemedicine health care services (the "Services") as deemed necessary by the treating Practitioner. I acknowledge and consent to receive the Services by the Practitioner via telemedicine. I understand that the telemedicine visit will involve communicating with the Practitioner through live audiovisual communication technology and the disclosure of certain medical information by electronic transmission. I acknowledge that I have been given the opportunity to request an in-person assessment or other available alternative prior to the telemedicine visit and am voluntarily participating in the telemedicine visit.  I understand that I have the right to withhold or withdraw my consent to the use of telemedicine in the course of my care at any time, without affecting my right to future care or treatment, and that the Practitioner or I may terminate the telemedicine visit at any time. I understand that I have the right to inspect all information obtained and/or recorded in the course of the telemedicine visit and may receive copies of available information for a reasonable fee.  I understand that some of the potential risks of receiving the Services via telemedicine include:  Delay or interruption in medical evaluation due to technological equipment failure or disruption; Information transmitted may not be sufficient (e.g. poor resolution of images) to allow for appropriate medical decision making by the  Practitioner; and/or  In rare instances, security protocols could fail, causing a breach of personal health information.  Furthermore, I acknowledge that it is my responsibility to provide information about my medical history, conditions and care that is complete and accurate to the best of my ability. I acknowledge that Practitioner's advice, recommendations, and/or decision may be based on factors not within their control, such as incomplete or inaccurate data provided by me or distortions of diagnostic images or specimens that may result from electronic transmissions. I understand that the practice of medicine is not an exact science and that Practitioner makes no warranties or guarantees regarding treatment outcomes. I acknowledge that a copy of this consent can be made available to me via my patient portal [facility] MyChart), or I can request a printed copy by calling the office of [facility].    I understand that my insurance will be billed for this visit.   I have read or had this consent read to me. I understand the contents of this consent, which adequately explains the benefits and risks of the Services being provided via telemedicine.  I have been provided ample opportunity to ask questions regarding this consent and the Services and have had my questions answered to my satisfaction. I give my informed consent for the services to be provided through the use of telemedicine in my medical care

## 2023-05-18 NOTE — Telephone Encounter (Signed)
 3rd final attempt to reach pt regarding surgical clearance.  Have been unsuccessful in reaching pt or getting a call back.  Will get this out of the preop pool and send back to the requesting surgeon's office to make them aware and have them try to contact pt and have pt contact the office.

## 2023-05-20 ENCOUNTER — Other Ambulatory Visit: Payer: Self-pay | Admitting: Internal Medicine

## 2023-06-14 ENCOUNTER — Ambulatory Visit: Attending: Cardiology | Admitting: Emergency Medicine

## 2023-06-14 DIAGNOSIS — Z01810 Encounter for preprocedural cardiovascular examination: Secondary | ICD-10-CM | POA: Diagnosis not present

## 2023-06-14 NOTE — Progress Notes (Signed)
 Virtual Visit via Telephone Note   Because of Steven Le co-morbid illnesses, he is at least at moderate risk for complications without adequate follow up.  This format is felt to be most appropriate for this patient at this time.  Due to technical limitations with video connection (technology), today's appointment will be conducted as an audio only telehealth visit, and [name] verbally agreed to proceed in this manner.   All issues noted in this document were discussed and addressed.  No physical exam could be performed with this format.  Evaluation Performed:  Preoperative cardiovascular risk assessment _____________   Date:  06/14/2023   Patient ID:  Steven Le, DOB 1965/12/28, MRN [id number] Patient Location:  Home Provider location:   Office  Primary Care Provider:  Rae Bugler, MD Primary Cardiologist:  Jann Melody, MD  Chief Complaint / Patient Profile   58 y.o. y/o male with a h/o OSA, hypertension, hyperlipidemia, paroxysmal atrial fibrillation, history of TIA, elevated CAC who is pending colonoscopy on 06/27/2023 by [facility] physicians by Dr. Honey Lusty for and presents today for telephonic preoperative cardiovascular risk assessment.  History of Present Illness    Steven Le is a 58 y.o. male who presents via Web designer for a telehealth visit today.  Pt was last seen in cardiology clinic on 04/28/2023 by Dr. Micael Adas.  At that time Delan Ksiazek was doing well.  The patient is now pending procedure as outlined above. Since his last visit, he denies chest pain, shortness of breath, lower extremity edema, fatigue, palpitations, melena, hematuria, hemoptysis, diaphoresis, weakness, presyncope, syncope, orthopnea, and PND.  Today patient is doing well overall.  He denies any acute cardiovascular concerns or complaints.  He is without any chest pain or shortness of breath.  No PND, orthopnea, leg swelling.  Weight has been stable.  Denies  any recent palpitations or tachycardia.  Reports may have had some recurrent atrial fibrillation over the past year however he notes a very low duration and frequency.  He stays relatively active.  Reports that he does walk at least a mile 3 times a week.  He denies any exertional symptoms on activity.  Overall he is able to complete greater than 4 METS.  Past Medical History    Past Medical History:  Diagnosis Date   ED (erectile dysfunction)    Facial basal cell cancer    Fatty infiltration of liver    GERD (gastroesophageal reflux disease)    Hypercholesteremia    Hyperlipidemia    Hypertension    Hypogonadism in male    Low testosterone     Palpitations    Pre-diabetes    Tendonitis    No past surgical history on file.  Allergies  Allergies  Allergen Reactions   Cough & Chest Congestion Dm [Dextromethorphan-Guaifenesin]    Dextromethorphan Hbr     Other Reaction(s): hives(DM coough syrup)   Niacin     Other Reaction(s): facial flushing    Home Medications    Prior to Admission medications   Medication Sig Start Date End Date Taking? Authorizing Provider  dicyclomine (BENTYL) 20 MG tablet Take 20 mg by mouth. 04/27/23   [provider]  diltiazem  (CARDIZEM  CD) 120 MG 24 hr capsule Take 1 capsule (120 mg total) by mouth daily. 05/23/23   Jann Melody, MD  diltiazem  (CARDIZEM ) 30 MG tablet Take 1 tablet (30 mg total) by mouth every 6 (six) hours as needed (palpitations). 10/06/21   Jann Melody, MD  ELIQUIS  5 MG  TABS tablet TAKE 1 TABLET(5 MG) BY MOUTH TWICE DAILY 05/01/23   [name], [name], MD  losartan  (COZAAR ) 25 MG tablet Take 1 tablet (25 mg total) by mouth daily. 04/25/22   Jann Melody, MD  Multiple Vitamin (MULTIVITAMIN) tablet Take 1 tablet by mouth daily.    [provider]  rosuvastatin (CRESTOR) 5 MG tablet Take 5 mg by mouth daily.    [provider]  sildenafil (VIAGRA) 25 MG tablet Take 25 mg by  mouth as needed for erectile dysfunction.    [provider]    Physical Exam    Vital Signs:  Steven Le does not have vital signs available for review today.  Given telephonic nature of communication, physical exam is limited. AAOx3. NAD. Normal affect.  Speech and respirations are unlabored.  Accessory Clinical Findings    None  Assessment & Plan    1.  Preoperative Cardiovascular Risk Assessment: According to the Revised Cardiac Risk Index (RCRI), his Perioperative Risk of Major Cardiac Event is (%): 0.9. His Functional Capacity in METs is: 6.45 according to the Duke Activity Status Index (DASI). Therefore, based on ACC/AHA guidelines, patient would be at acceptable risk for the planned procedure without further cardiovascular testing.   The patient was advised that if he develops new symptoms prior to surgery to contact our office to arrange for a follow-up visit, and he verbalized understanding.  Per office protocol, patient can hold Eliquis  for 2 days prior to procedure.   Patient will not need bridging with Lovenox (enoxaparin) around procedure.  A copy of this note will be routed to requesting surgeon.  Time:   Today, I have spent 8 minutes with the patient with telehealth technology discussing medical history, symptoms, and management plan.     Ava Boatman, NP  06/14/2023, 9:54 AM

## 2023-06-30 ENCOUNTER — Telehealth: Admitting: Cardiology

## 2023-08-20 ENCOUNTER — Other Ambulatory Visit: Payer: Self-pay | Admitting: Internal Medicine

## 2023-08-21 ENCOUNTER — Ambulatory Visit: Admitting: Internal Medicine

## 2023-08-24 IMAGING — CT CT CARDIAC CORONARY ARTERY CALCIUM SCORE
3 series · 13 of 20 positions shown, 15 images · non-contrast
Comparison: None Available.

CLINICAL DATA: 56-year-old white male

EXAM:
CT CARDIAC CORONARY ARTERY CALCIUM SCORE
TECHNIQUE: Non-contrast imaging through the heart was performed using
prospective ECG gating. Image post processing was performed on an
independent workstation, allowing for quantitative analysis of the
heart and coronary arteries. Note that this exam targets the heart
and the chest was not imaged in its entirety.

[Series 2: calcium scoring 2.00 qr36 bestdiast 70% hrt calciu · axial · 0.40mm/px · z∈[+1604,+1660]mm · 3 of 70 slices shown]
[im 14/70  vessel]
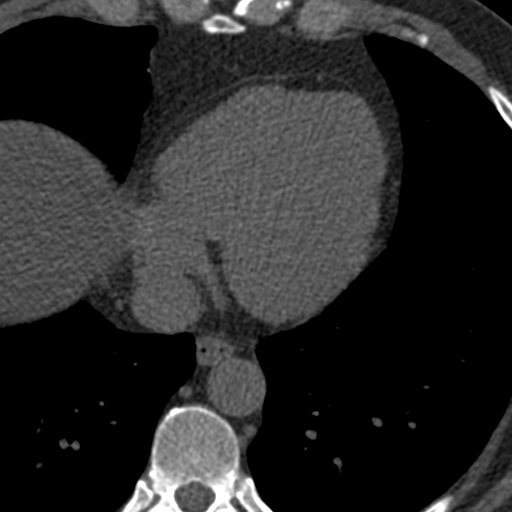
[im 28/70  vessel]
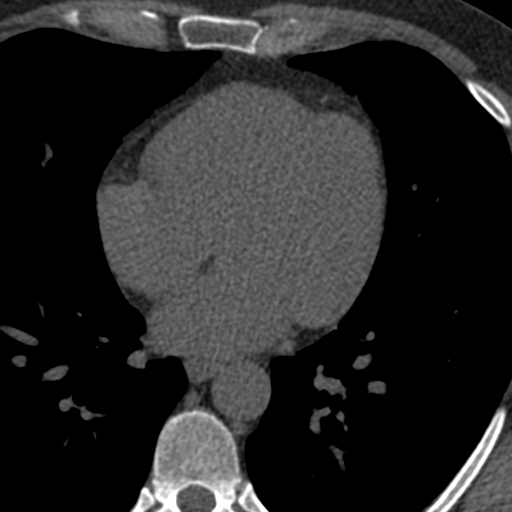
[im 42/70  vessel]
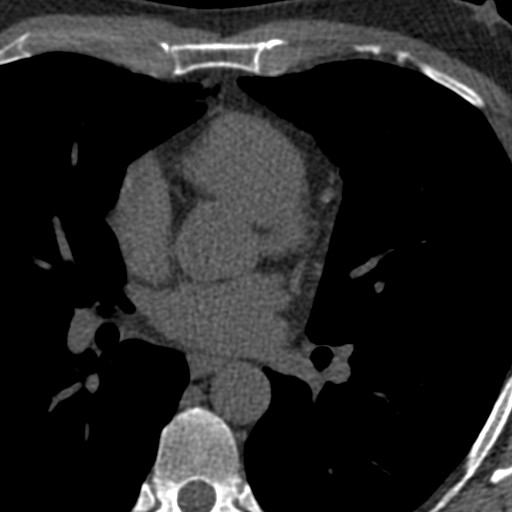

[Series 3: calcium scoring 2.00 br40 bestdiast 70% axial · axial · 0.58mm/px · z∈[+1600,+1692]mm · 5 of 70 slices shown, 7 images]
[im 12/70  vessel]
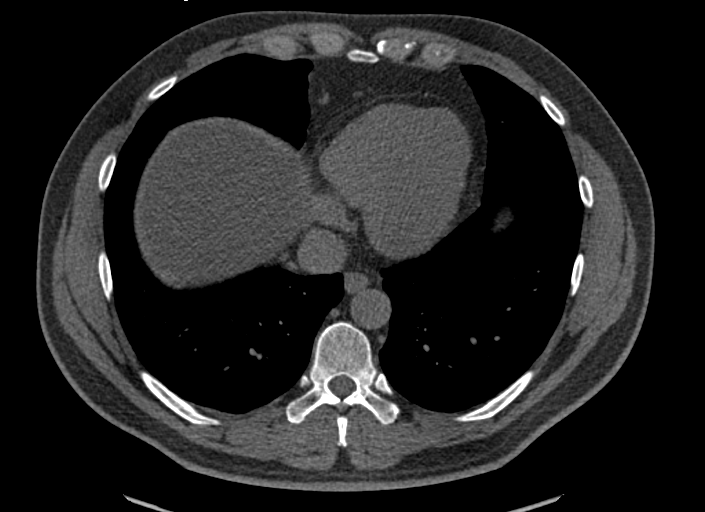
[im 12/70  lung]
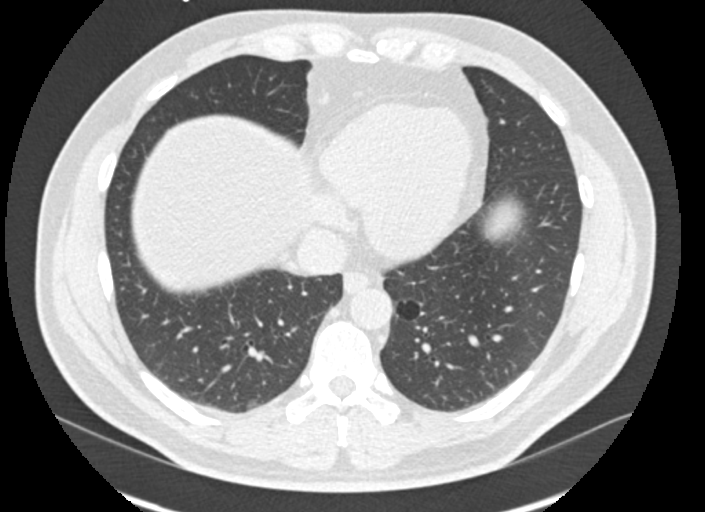
[im 24/70  vessel]
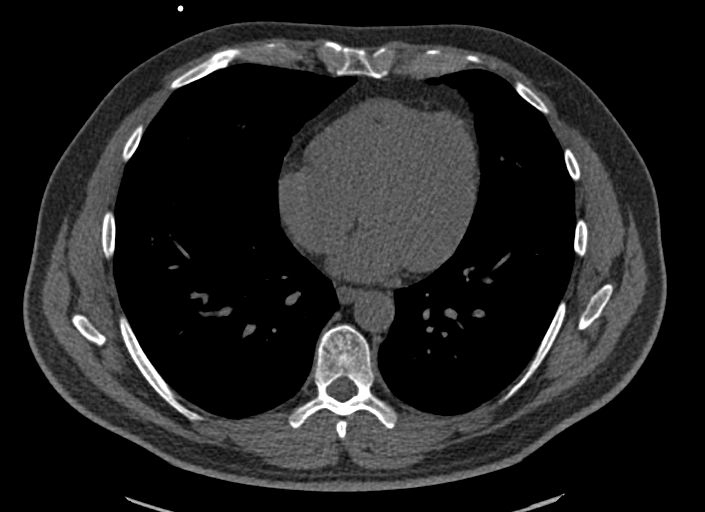
[im 35/70  vessel]
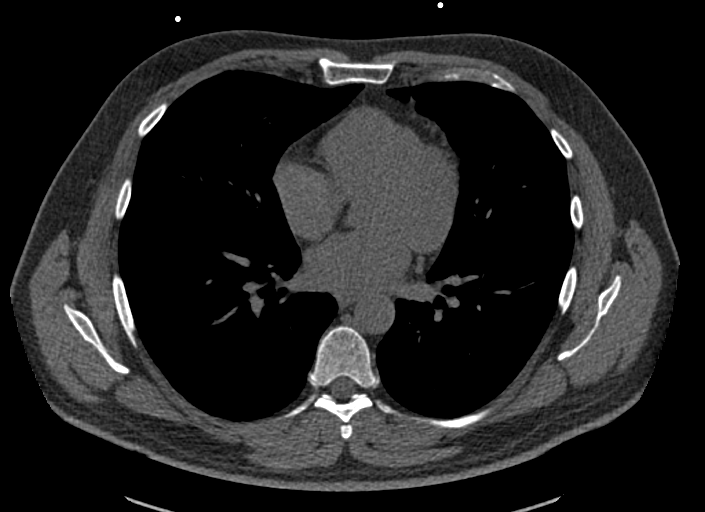
[im 47/70  vessel]
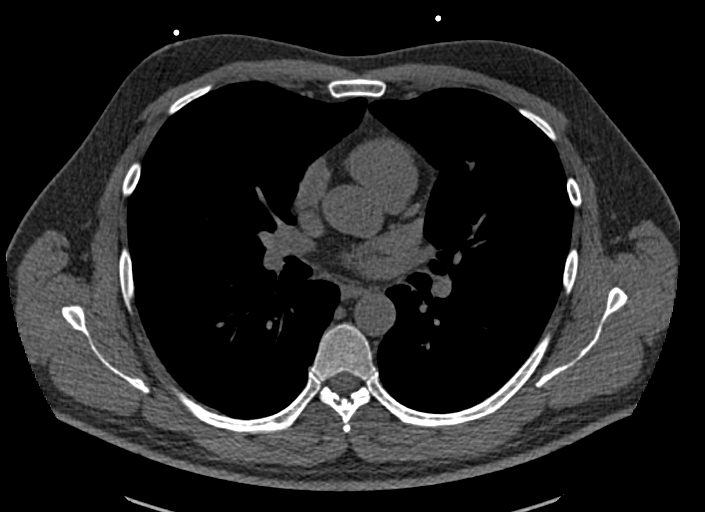
[im 58/70  vessel]
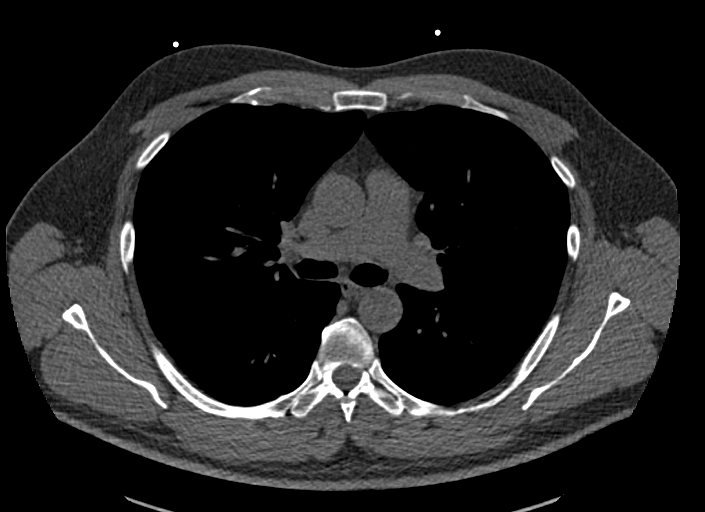
[im 58/70  lung]
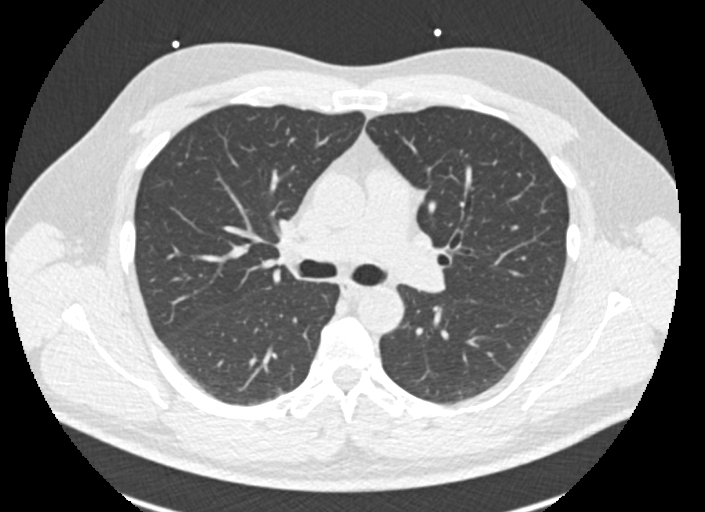

[Series 9: calcium scoring 2.00 br60 bestdiast 70% lungs · axial · 0.58mm/px · z∈[+1600,+1692]mm · 5 of 70 slices shown]
[im 12/70  vessel]
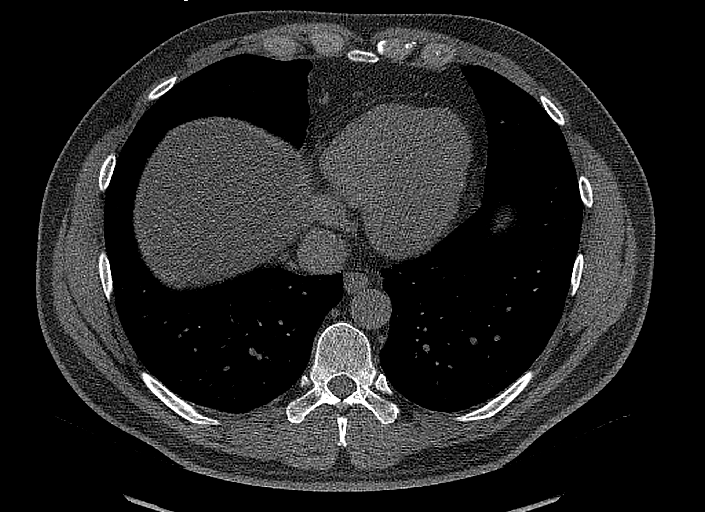
[im 24/70  vessel]
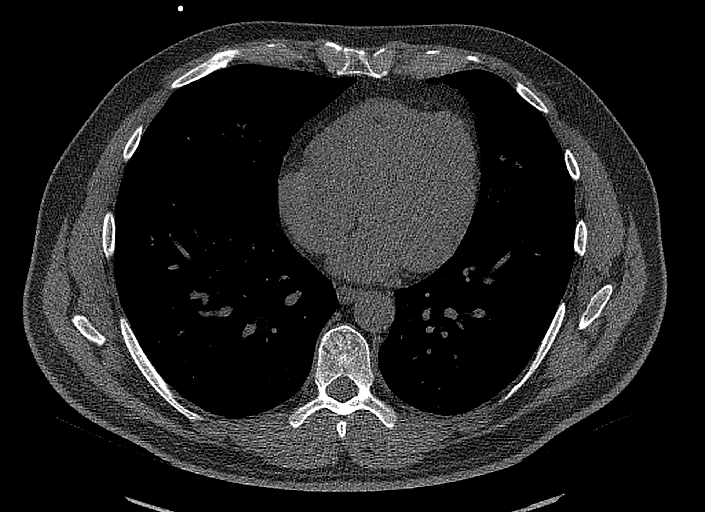
[im 35/70  vessel]
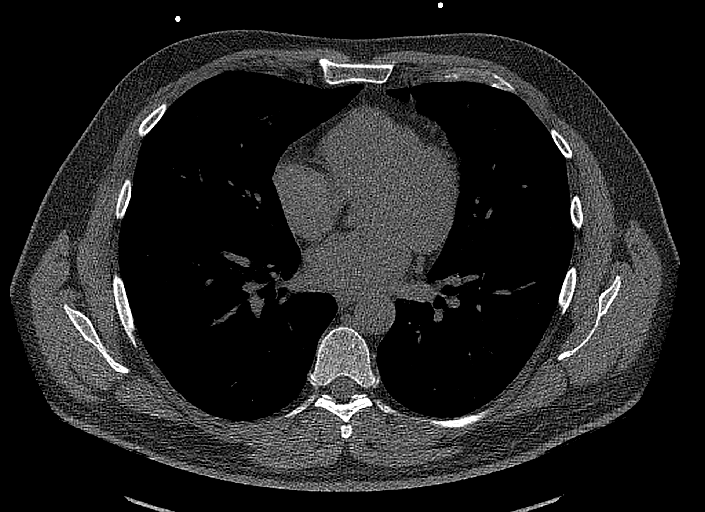
[im 47/70  vessel]
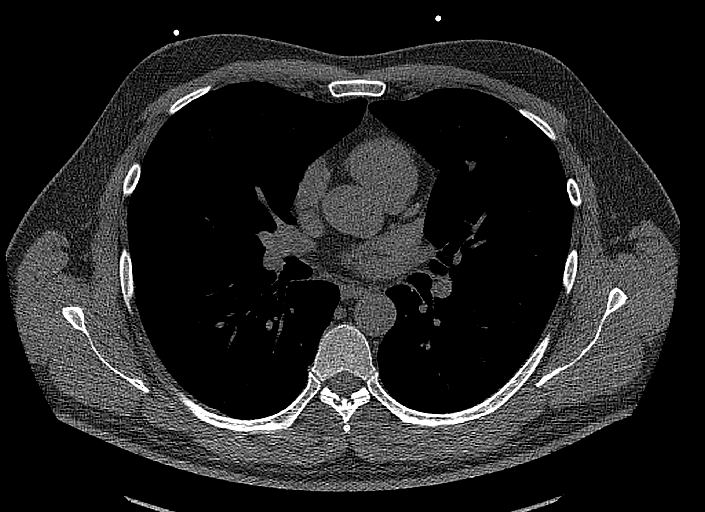
[im 58/70  vessel]
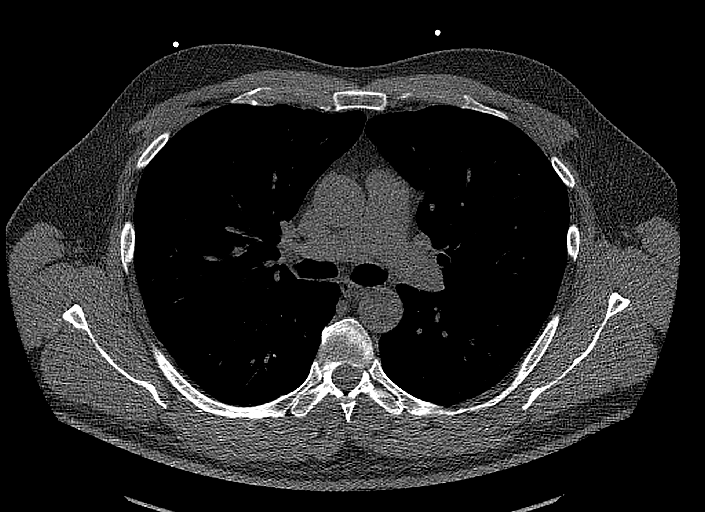

[13 of 20 positions shown; findings below may reference images not displayed]

FINDINGS: CORONARY CALCIUM SCORES:

Left Main: 0

LAD:

LCx: 0

RCA: 0

Total Agatston Score:

[HOSPITAL] percentile:

AORTA MEASUREMENTS:

Ascending Aorta: 28 mm

Descending Aorta: 25 mm

OTHER FINDINGS:

Vascular: Normal heart size with no pericardial effusion. Normal
caliber thoracic aorta with mild atherosclerotic disease.

Mediastinum/Nodes: Esophagus is unremarkable. No pathologically
enlarged lymph nodes seen in the chest.

Lungs/Pleura: Central airways are patent. No consolidation, pleural
effusion or pneumothorax.

Upper Abdomen: Hepatic steatosis.

Musculoskeletal: No chest wall mass or suspicious bone lesions
identified.
IMPRESSION: Total calcium score of 27.9 with is at percentile 58 based for
subjects of the same age, gender, and race/ethnicity.

## 2023-09-17 ENCOUNTER — Other Ambulatory Visit: Payer: Self-pay | Admitting: Internal Medicine

## 2023-10-24 NOTE — Progress Notes (Unsigned)
 Cardiology Office Note:  .    Date:  10/24/2023  ID:  Steven Le, DOB 04/12/1965, MRN [id number] PCP: Seabron Lenis, MD  [facility] Providers Cardiologist:  Stanly DELENA Leavens, MD { Click to update primary MD,subspecialty MD or APP then REFRESH:1}    CC: *** Consulted for the evaluation of *** at the behest of ***   History of Present Illness: .    Steven Le is a 58 y.o. male ***  Discussed the use of AI scribe software for clinical note transcription with the patient, who gave verbal consent to proceed.   Relevant histories: .  Social *** ROS: As per HPI.   Studies Reviewed: .     Cardiac Studies & Procedures   ______________________________________________________________________________________________     ECHOCARDIOGRAM  ECHOCARDIOGRAM COMPLETE 11/29/2021  Narrative ECHOCARDIOGRAM REPORT    Patient Name:   Steven Le Date of Exam: 11/29/2021 Medical Rec #:  [id number]       Height:       73.0 in Accession #:    [id number]      Weight:       207.0 lb Date of Birth:  10/13/1965       BSA:          2.183 m Patient Age:    56 years        BP:           100/60 mmHg Patient Gender: M               HR:           77 bpm. Exam Location:  [address]  Procedure: 2D Echo, Cardiac Doppler and Color Doppler  Indications:    I48.0 Atrial Fibrillation  History:        Patient has no prior history of Echocardiogram examinations. TIA, Arrythmias:Atrial Fibrillation; Risk Factors:Hypertension, Dyslipidemia, Family History of Coronary Artery Disease and Former Smoker. Palpitations.  Sonographer:    Heather Hawks RDCS Referring Phys: [facility] Terrie Grajales  IMPRESSIONS   1. Left ventricular ejection fraction, by estimation, is >75%. The left ventricle has hyperdynamic function. The left ventricle has no regional wall motion abnormalities. Left ventricular diastolic parameters were normal. 2. Right ventricular systolic function is  normal. The right ventricular size is normal. There is normal pulmonary artery systolic pressure. The estimated right ventricular systolic pressure is 31.3 mmHg. 3. The mitral valve is grossly normal. Trivial mitral valve regurgitation. 4. The aortic valve is tricuspid. Aortic valve regurgitation is not visualized. 5. The inferior vena cava is normal in size with greater than 50% respiratory variability, suggesting right atrial pressure of 3 mmHg.  Comparison(s): No prior Echocardiogram.  FINDINGS Left Ventricle: Left ventricular ejection fraction, by estimation, is >75%. The left ventricle has hyperdynamic function. The left ventricle has no regional wall motion abnormalities. The left ventricular internal cavity size was normal in size. There is no left ventricular hypertrophy. Left ventricular diastolic parameters were normal.  Right Ventricle: The right ventricular size is normal. No increase in right ventricular wall thickness. Right ventricular systolic function is normal. There is normal pulmonary artery systolic pressure. The tricuspid regurgitant velocity is 2.66 m/s, and with an assumed right atrial pressure of 3 mmHg, the estimated right ventricular systolic pressure is 31.3 mmHg.  Left Atrium: Left atrial size was normal in size.  Right Atrium: Right atrial size was normal in size.  Pericardium: There is no evidence of pericardial effusion.  Mitral Valve: The mitral valve is grossly normal. Trivial  mitral valve regurgitation.  Tricuspid Valve: The tricuspid valve is grossly normal. Tricuspid valve regurgitation is trivial.  Aortic Valve: The aortic valve is tricuspid. Aortic valve regurgitation is not visualized. Aortic valve mean gradient measures 7.0 mmHg. Aortic valve peak gradient measures 13.2 mmHg. Aortic valve area, by VTI measures 1.85 cm.  Pulmonic Valve: The pulmonic valve was normal in structure. Pulmonic valve regurgitation is not visualized.  Aorta: The aortic  root and ascending aorta are structurally normal, with no evidence of dilitation.  Venous: The inferior vena cava is normal in size with greater than 50% respiratory variability, suggesting right atrial pressure of 3 mmHg.  IAS/Shunts: No atrial level shunt detected by color flow Doppler.   LEFT VENTRICLE PLAX 2D LVIDd:         4.90 cm   Diastology LVIDs:         2.60 cm   LV e' medial:    12.60 cm/s LV PW:         0.90 cm   LV E/e' medial:  7.1 LV IVS:        0.80 cm   LV e' lateral:   18.80 cm/s LVOT diam:     2.00 cm   LV E/e' lateral: 4.8 LV SV:         61 LV SV Index:   28 LVOT Area:     3.14 cm   RIGHT VENTRICLE RV Basal diam:  3.20 cm RV S prime:     15.00 cm/s TAPSE (M-mode): 3.3 cm  LEFT ATRIUM             Index        RIGHT ATRIUM           Index LA diam:        4.10 cm 1.88 cm/m   RA Area:     14.50 cm LA Vol (A2C):   48.0 ml 21.98 ml/m  RA Volume:   32.20 ml  14.75 ml/m LA Vol (A4C):   31.3 ml 14.34 ml/m LA Biplane Vol: 39.5 ml 18.09 ml/m AORTIC VALVE AV Area (Vmax):    1.90 cm AV Area (Vmean):   1.84 cm AV Area (VTI):     1.85 cm AV Vmax:           182.00 cm/s AV Vmean:          121.000 cm/s AV VTI:            0.330 m AV Peak Grad:      13.2 mmHg AV Mean Grad:      7.0 mmHg LVOT Vmax:         110.00 cm/s LVOT Vmean:        70.950 cm/s LVOT VTI:          0.194 m LVOT/AV VTI ratio: 0.59  AORTA Ao Root diam: 3.10 cm Ao Asc diam:  2.90 cm  MITRAL VALVE               TRICUSPID VALVE MV Area (PHT): cm         TR Peak grad:   28.3 mmHg MV Decel Time: 197 msec    TR Vmax:        266.00 cm/s MV E velocity: 90.05 cm/s MV A velocity: 73.90 cm/s  SHUNTS MV E/A ratio:  1.22        Systemic VTI:  0.19 m Systemic Diam: 2.00 cm  Vinie Maxcy MD Electronically signed by Vinie Maxcy MD Signature Date/Time: 11/29/2021/8:39:27  PM    Final    MONITORS  CARDIAC EVENT MONITOR 11/16/2021  Narrative   Patient had a minimum heart rate of 54 bpm,  maximum heart rate of 160 bpm, and average heart rate of 79 bpm.   Predominant underlying rhythm was sinus rhythm.   Rare atrial fibrillation RVR (7 minutes).   Isolated PACs were rare (<1.0%).   Isolated PVCs were rare (<1.0%).   Triggered and diary events associated with A fib and SR.  Rare atrial fibrillation.   CT SCANS  CT CARDIAC SCORING (SELF PAY ONLY) 09/18/2015  Narrative CLINICAL DATA:  Essential hypertension. Family history of heart disease. Nonsmoker.  EXAM: CT HEART FOR CALCIUM SCORING  TECHNIQUE: CT heart was performed on a 64 channel system using prospective ECG gating.  A non-contrast exam for calcium scoring was performed.  Note that this exam targets the heart and the chest was not imaged in its entirety.  COMPARISON:  None.  FINDINGS: Technical quality: Good.  CORONARY CALCIUM  Total Agatston Score: 0  OTHER  FINDINGS:  Cardiovascular: Normal heart size.  Normal aortic caliber.  Mediastinum/Nodes: No imaged thoracic adenopathy, given limitations of noncontrast technique.  Lungs/Pleura: No pleural fluid.  Clear lungs.  Upper Abdomen: Moderate hepatic steatosis. Normal imaged portions of the spleen, stomach.  Musculoskeletal: No acute osseous abnormality.  IMPRESSION: 1. No coronary calcium. 2. Moderate hepatic steatosis. 3.  No acute process in the chest.   Electronically Signed By: Rockey Kilts M.D. On: 09/18/2015 13:55     ______________________________________________________________________________________________        Risk Assessment/Calculations:    {Does this patient have ATRIAL FIBRILLATION?[phone]}       Physical Exam:    VS:  There were no vitals taken for this visit.   Wt Readings from Last 3 Encounters:  04/28/23 196 lb (88.9 kg)  02/27/23 196 lb (88.9 kg)  04/25/22 198 lb 9.6 oz (90.1 kg)    Gen: *** distress, *** obese/well nourished/malnourished   Neck: No JVD, *** carotid bruit Ears: *** Frank  Sign Cardiac: No Rubs or Gallops, *** Murmur, ***cardia, *** radial pulses Respiratory: Clear to auscultation bilaterally, *** effort, ***  respiratory rate GI: Soft, nontender, non-distended *** MS: No *** edema; *** moves all extremities Integument: Skin feels *** Neuro:  At time of evaluation, alert and oriented to person/place/time/situation *** Psych: Normal affect, patient feels ***   ASSESSMENT AND PLAN: .    *** An EKG was ordered for *** and shows ***  PAF - hx of HTN, TIA in 2023 , CAC   HLD with CAC  OSA - Sleep tx with Dr. Shlomo Stanly Leavens, MD [facility] Cardiologist [facility]  [facility]  [address] [place], [address] [place], [place] [address] [phone]  2:33 PM

## 2023-10-25 ENCOUNTER — Ambulatory Visit: Attending: Internal Medicine | Admitting: Internal Medicine

## 2023-10-25 VITALS — BP 109/72 | HR 72 | Ht 73.0 in | Wt 202.0 lb

## 2023-10-25 DIAGNOSIS — G4733 Obstructive sleep apnea (adult) (pediatric): Secondary | ICD-10-CM

## 2023-10-25 DIAGNOSIS — I480 Paroxysmal atrial fibrillation: Secondary | ICD-10-CM

## 2023-10-25 DIAGNOSIS — Z01810 Encounter for preprocedural cardiovascular examination: Secondary | ICD-10-CM

## 2023-10-25 DIAGNOSIS — I10 Essential (primary) hypertension: Secondary | ICD-10-CM | POA: Diagnosis not present

## 2023-10-25 NOTE — Patient Instructions (Signed)
 Medication Instructions:  Your physician recommends that you continue on your current medications as directed. Please refer to the Current Medication list given to you today.  *If you need a refill on your cardiac medications before your next appointment, please call your pharmacy*  Lab Work: none If you have labs (blood work) drawn today and your tests are completely normal, you will receive your results only by: MyChart Message (if you have MyChart) OR A paper copy in the mail If you have any lab test that is abnormal or we need to change your treatment, we will call you to review the results.  Testing/Procedures: none  Follow-Up: At [facility], you and your health needs are our priority.  As part of our continuing mission to provide you with exceptional heart care, our providers are all part of one team.  This team includes your primary Cardiologist (physician) and Advanced Practice Providers or APPs (Physician Assistants and Nurse Practitioners) who all work together to provide you with the care you need, when you need it.  Your next appointment:   12 month(s)  Provider:   Stanly DELENA Leavens, MD    We recommend signing up for the patient portal called MyChart.  Sign up information is provided on this After Visit Summary.  MyChart is used to connect with patients for Virtual Visits (Telemedicine).  Patients are able to view lab/test results, encounter notes, upcoming appointments, etc.  Non-urgent messages can be sent to your provider as well.   To learn more about what you can do with MyChart, go to [email or web address].   Other Instructions

## 2023-10-27 ENCOUNTER — Encounter: Payer: Self-pay | Admitting: Internal Medicine

## 2023-12-13 ENCOUNTER — Encounter: Payer: Self-pay | Admitting: Cardiology

## 2023-12-14 ENCOUNTER — Other Ambulatory Visit: Payer: Self-pay

## 2023-12-14 DIAGNOSIS — G4733 Obstructive sleep apnea (adult) (pediatric): Secondary | ICD-10-CM

## 2023-12-22 ENCOUNTER — Emergency Department (HOSPITAL_COMMUNITY)
Admission: EM | Admit: 2023-12-22 | Discharge: 2023-12-22 | Disposition: A | Discharge status: Home / Self Care | Attending: Emergency Medicine | Admitting: Emergency Medicine

## 2023-12-22 ENCOUNTER — Encounter (HOSPITAL_COMMUNITY): Payer: Self-pay

## 2023-12-22 ENCOUNTER — Other Ambulatory Visit: Payer: Self-pay

## 2023-12-22 DIAGNOSIS — Z7901 Long term (current) use of anticoagulants: Secondary | ICD-10-CM | POA: Diagnosis not present

## 2023-12-22 DIAGNOSIS — Z23 Encounter for immunization: Secondary | ICD-10-CM | POA: Insufficient documentation

## 2023-12-22 DIAGNOSIS — S61230A Puncture wound without foreign body of right index finger without damage to nail, initial encounter: Secondary | ICD-10-CM | POA: Insufficient documentation

## 2023-12-22 DIAGNOSIS — W5501XA Bitten by cat, initial encounter: Secondary | ICD-10-CM | POA: Diagnosis not present

## 2023-12-22 DIAGNOSIS — Z85828 Personal history of other malignant neoplasm of skin: Secondary | ICD-10-CM | POA: Diagnosis not present

## 2023-12-22 DIAGNOSIS — Z79899 Other long term (current) drug therapy: Secondary | ICD-10-CM | POA: Insufficient documentation

## 2023-12-22 DIAGNOSIS — S6991XA Unspecified injury of right wrist, hand and finger(s), initial encounter: Secondary | ICD-10-CM | POA: Diagnosis present

## 2023-12-22 DIAGNOSIS — S61031A Puncture wound without foreign body of right thumb without damage to nail, initial encounter: Secondary | ICD-10-CM | POA: Diagnosis not present

## 2023-12-22 DIAGNOSIS — I10 Essential (primary) hypertension: Secondary | ICD-10-CM | POA: Diagnosis not present

## 2023-12-22 DIAGNOSIS — Y92009 Unspecified place in unspecified non-institutional (private) residence as the place of occurrence of the external cause: Secondary | ICD-10-CM | POA: Insufficient documentation

## 2023-12-22 MED ORDER — AMOXICILLIN-POT CLAVULANATE 875-125 MG PO TABS: 1.0000 | ORAL_TABLET | Freq: Two times a day (BID) | ORAL | 0 refills | Status: AC

## 2023-12-22 MED ORDER — AMOXICILLIN-POT CLAVULANATE 875-125 MG PO TABS
1.0000 | ORAL_TABLET | Freq: Once | ORAL | Status: AC
  Administered 2023-12-22: 1 via ORAL
  Filled 2023-12-22: qty 1

## 2023-12-22 MED ORDER — TETANUS-DIPHTH-ACELL PERTUSSIS 5-2-15.5 LF-MCG/0.5 IM SUSP
0.5000 mL | Freq: Once | INTRAMUSCULAR | Status: AC
  Administered 2023-12-22: 0.5 mL via INTRAMUSCULAR
  Filled 2023-12-22: qty 0.5

## 2023-12-22 NOTE — ED Triage Notes (Signed)
 Pt bib pov. Pt was bit by his cat multiple times. Pt endorses inflammation and pain in joint area of his right hand. Pt has visible swelling of right thumb and index finger. Pt denies fever or chills. Pt states cat is UTD on vaccines.

## 2023-12-22 NOTE — Discharge Instructions (Addendum)
 For pain control you may take 1000 mg of acetaminophen (Tylenol) every 8 hours and/or 600 mg of Ibuprofen (Motrin, Advil, etc.) every 6-8 hours as needed.  Please limit acetaminophen (Tylenol) to 4000 mg and Ibuprofen (Motrin, Advil, etc.) to 2400 mg for a 24hr period. Please note that other over-the-counter medicine may contain acetaminophen or ibuprofen as a component of their ingredients.

## 2023-12-22 NOTE — ED Provider Notes (Signed)
 [place] EMERGENCY DEPARTMENT AT [facility] Provider Note  CSN: [id number] Arrival date & time: 12/22/23 9687  Chief Complaint(s) Animal Bite  HPI Steven Le is a 58 y.o. male here for right hand pain due to multiple cat bites that occurred last night.  Patient reports that there is a gas with the dog in the house and while trying to pick up the cat, the cat freaked out and bit him.  Cat is up-to-date on all vaccinations.  Patient began having pain to the thumb and index fingers where he was bit prompting his visit.  No other physical complaints The history is provided by the patient.    Past Medical History Past Medical History:  Diagnosis Date   ED (erectile dysfunction)    Facial basal cell cancer    Fatty infiltration of liver    GERD (gastroesophageal reflux disease)    Hypercholesteremia    Hyperlipidemia    Hypertension    Hypogonadism in male    Low testosterone     Palpitations    Pre-diabetes    Tendonitis    Patient Active Problem List   Diagnosis Date Noted   Daytime somnolence 04/25/2022   Paroxysmal atrial fibrillation (HCC) 11/03/2021   History of TIA (transient ischemic attack) 10/06/2021   Mixed hyperlipidemia 10/06/2021   Essential hypertension 07/22/2021   Gastroesophageal reflux disease 07/22/2021   Male hypogonadism 07/22/2021   Prediabetes 07/22/2021   Home Medication(s) Prior to Admission medications   Medication Sig Start Date End Date Taking? Authorizing Provider  amoxicillin -clavulanate (AUGMENTIN ) 875-125 MG tablet Take 1 tablet by mouth every 12 (twelve) hours for 10 days. 12/22/23 01/01/24 Yes Gregroy Dombkowski, Raynell Moder, MD  dicyclomine (BENTYL) 20 MG tablet Take 20 mg by mouth. 04/27/23   [provider]  diltiazem  (CARDIZEM  CD) 120 MG 24 hr capsule TAKE 1 CAPSULE(120 MG) BY MOUTH DAILY 09/18/23   [name], [name], MD  diltiazem  (CARDIZEM ) 30 MG tablet Take 1 tablet (30 mg total) by mouth every 6 (six) hours as  needed (palpitations). 10/06/21   [name], [name], MD  ELIQUIS  5 MG TABS tablet TAKE 1 TABLET(5 MG) BY MOUTH TWICE DAILY 05/01/23   [name], [name], MD  losartan  (COZAAR ) 25 MG tablet Take 1 tablet (25 mg total) by mouth daily. 04/25/22   Santo Stanly LABOR, MD  Multiple Vitamin (MULTIVITAMIN) tablet Take 1 tablet by mouth daily.    [provider]  rosuvastatin (CRESTOR) 5 MG tablet Take 5 mg by mouth daily.    [provider]  sildenafil (VIAGRA) 25 MG tablet Take 25 mg by mouth as needed for erectile dysfunction.    [provider]                                                                                                                                    Allergies Cough & chest congestion dm [dextromethorphan-guaifenesin], Dextromethorphan hbr, and Niacin  Review of Systems Review of Systems As noted in HPI  Physical Exam Vital Signs  I have reviewed the triage vital signs BP 136/65 (BP Location: Right Arm)   Pulse 74   Temp 97.6 F (36.4 C) (Oral)   Resp 18   Ht 6' 1 (1.854 m)   Wt 88.5 kg   SpO2 98%   BMI 25.73 kg/m   Physical Exam Vitals reviewed.  Constitutional:      General: He is not in acute distress.    Appearance: He is well-developed. He is not diaphoretic.  HENT:     Head: Normocephalic and atraumatic.     Right Ear: External ear normal.     Left Ear: External ear normal.     Nose: Nose normal.     Mouth/Throat:     Mouth: Mucous membranes are moist.  Eyes:     General: No scleral icterus.    Conjunctiva/sclera: Conjunctivae normal.  Neck:     Trachea: Phonation normal.  Cardiovascular:     Rate and Rhythm: Normal rate and regular rhythm.  Pulmonary:     Effort: Pulmonary effort is normal. No respiratory distress.     Breath sounds: No stridor.  Abdominal:     General: There is no distension.  Musculoskeletal:        General: Normal range of motion.     Cervical back: Normal range of motion.      Comments: Multiple punctate wounds to the index finger and thumb.  Mild hyperemia surrounding some of the bites with associated swelling.  Decreased range of motion due to the swelling.  No tenderness to palpation along the flexor tendon.    Neurological:     Mental Status: He is alert and oriented to person, place, and time.  Psychiatric:        Behavior: Behavior normal.     ED Results and Treatments Labs (all labs ordered are listed, but only abnormal results are displayed) Labs Reviewed - No data to display                                                                                                                       EKG  EKG Interpretation Date/Time:    Ventricular Rate:    PR Interval:    QRS Duration:    QT Interval:    QTC Calculation:   R Axis:      Text Interpretation:         Radiology No results found.  Medications Ordered in ED Medications  amoxicillin-clavulanate (AUGMENTIN) 875-125 MG per tablet 1 tablet (1 tablet Oral Given 12/22/23 0521)  Tdap (ADACEL) injection 0.5 mL (0.5 mLs Intramuscular Given 12/22/23 0521)   Procedures Procedures  (including critical care time) Medical Decision Making / ED Course   Medical Decision Making Risk Prescription drug management.     Clinical Course as of 12/22/23 0530  Fri Dec 22, 2023  0528 Cat bite to right hand.  Exam is not concerning  for deep tissue infection including flexor tenosynovitis.  With increased swelling and pain, will treat prophylactically with Augmentin. Tetanus booster updated.  Will have patient follow-up with hand [PC]    Clinical Course User Index [PC] Matea Stanard, Raynell Moder, MD    Final Clinical Impression(s) / ED Diagnoses Final diagnoses:  Cat bite, initial encounter   The patient appears reasonably screened and/or stabilized for discharge and I doubt any other medical condition or other [facility] requiring further screening, evaluation, or treatment in the ED at this time. I  have discussed the findings, Dx and Tx plan with the patient/family who expressed understanding and agree(s) with the plan. Discharge instructions discussed at length. The patient/family was given strict return precautions who verbalized understanding of the instructions. No further questions at time of discharge.  Disposition: Discharge  Condition: Good  ED Discharge Orders          Ordered    amoxicillin-clavulanate (AUGMENTIN) 875-125 MG tablet  Every 12 hours        12/22/23 0530              Follow Up: Marsa Christen, MD [facility] [address], [place] [address] [phone] Call  if symptoms do not improve or  worsen in 3-5 days  Seabron Lenis, MD [address] [place] [address] [phone]  Call  as needed     This chart was dictated using voice recognition software.  Despite best efforts to proofread,  errors can occur which can change the documentation meaning.    Trine Raynell Moder, MD 12/22/23 [phone]
